# Patient Record
Sex: Male | Born: 1945 | Race: White | Hispanic: No | Marital: Married | Smoking: Former smoker
Health system: Southern US, Community
[De-identification: ages and names within clinical notes are randomized; demographics above are authoritative.]

## PROBLEM LIST (undated history)

## (undated) DIAGNOSIS — G709 Myoneural disorder, unspecified: Secondary | ICD-10-CM

## (undated) DIAGNOSIS — J40 Bronchitis, not specified as acute or chronic: Secondary | ICD-10-CM

## (undated) DIAGNOSIS — K219 Gastro-esophageal reflux disease without esophagitis: Secondary | ICD-10-CM

## (undated) DIAGNOSIS — M1990 Unspecified osteoarthritis, unspecified site: Secondary | ICD-10-CM

## (undated) DIAGNOSIS — E785 Hyperlipidemia, unspecified: Secondary | ICD-10-CM

## (undated) DIAGNOSIS — R251 Tremor, unspecified: Secondary | ICD-10-CM

## (undated) DIAGNOSIS — I219 Acute myocardial infarction, unspecified: Secondary | ICD-10-CM

## (undated) DIAGNOSIS — I10 Essential (primary) hypertension: Secondary | ICD-10-CM

## (undated) DIAGNOSIS — I2510 Atherosclerotic heart disease of native coronary artery without angina pectoris: Secondary | ICD-10-CM

## (undated) HISTORY — DX: Tremor, unspecified: R25.1

## (undated) HISTORY — DX: Atherosclerotic heart disease of native coronary artery without angina pectoris: I25.10

## (undated) HISTORY — DX: Gastro-esophageal reflux disease without esophagitis: K21.9

## (undated) HISTORY — DX: Hyperlipidemia, unspecified: E78.5

## (undated) HISTORY — DX: Essential (primary) hypertension: I10

## (undated) HISTORY — DX: Myoneural disorder, unspecified: G70.9

## (undated) HISTORY — DX: Unspecified osteoarthritis, unspecified site: M19.90

## (undated) HISTORY — DX: Acute myocardial infarction, unspecified: I21.9

---

## 2001-08-25 DIAGNOSIS — I219 Acute myocardial infarction, unspecified: Secondary | ICD-10-CM

## 2001-08-25 HISTORY — DX: Acute myocardial infarction, unspecified: I21.9

## 2001-08-25 HISTORY — PX: CORONARY ANGIOPLASTY WITH STENT PLACEMENT: SHX49

## 2001-11-11 ENCOUNTER — Inpatient Hospital Stay (HOSPITAL_COMMUNITY): Admission: EM | Admit: 2001-11-11 | Discharge: 2001-11-16 | Payer: Self-pay | Admitting: *Deleted

## 2001-12-07 ENCOUNTER — Encounter (HOSPITAL_COMMUNITY): Admission: RE | Admit: 2001-12-07 | Discharge: 2002-01-24 | Payer: Self-pay | Admitting: Internal Medicine

## 2002-02-22 ENCOUNTER — Ambulatory Visit (HOSPITAL_COMMUNITY): Admission: RE | Admit: 2002-02-22 | Discharge: 2002-02-23 | Payer: Self-pay | Admitting: Cardiology

## 2002-05-11 ENCOUNTER — Inpatient Hospital Stay (HOSPITAL_COMMUNITY): Admission: AD | Admit: 2002-05-11 | Discharge: 2002-05-12 | Payer: Self-pay | Admitting: *Deleted

## 2002-11-03 ENCOUNTER — Inpatient Hospital Stay (HOSPITAL_COMMUNITY): Admission: EM | Admit: 2002-11-03 | Discharge: 2002-11-04 | Payer: Self-pay | Admitting: Emergency Medicine

## 2002-11-03 ENCOUNTER — Encounter: Payer: Self-pay | Admitting: Emergency Medicine

## 2005-02-10 ENCOUNTER — Ambulatory Visit: Payer: Self-pay | Admitting: Cardiology

## 2005-07-03 ENCOUNTER — Ambulatory Visit: Payer: Self-pay | Admitting: Gastroenterology

## 2005-07-25 ENCOUNTER — Ambulatory Visit: Payer: Self-pay | Admitting: Gastroenterology

## 2005-08-05 ENCOUNTER — Encounter (INDEPENDENT_AMBULATORY_CARE_PROVIDER_SITE_OTHER): Payer: Self-pay | Admitting: *Deleted

## 2005-08-05 ENCOUNTER — Ambulatory Visit: Payer: Self-pay | Admitting: Gastroenterology

## 2005-10-16 ENCOUNTER — Ambulatory Visit: Payer: Self-pay | Admitting: Cardiology

## 2005-10-17 ENCOUNTER — Ambulatory Visit: Payer: Self-pay | Admitting: Cardiology

## 2006-01-29 ENCOUNTER — Ambulatory Visit: Payer: Self-pay | Admitting: Cardiology

## 2006-05-11 ENCOUNTER — Ambulatory Visit: Payer: Self-pay | Admitting: Gastroenterology

## 2006-06-08 ENCOUNTER — Ambulatory Visit: Payer: Self-pay | Admitting: Gastroenterology

## 2006-07-29 ENCOUNTER — Ambulatory Visit: Payer: Self-pay | Admitting: Cardiology

## 2006-07-31 ENCOUNTER — Ambulatory Visit: Payer: Self-pay | Admitting: Cardiology

## 2006-09-03 ENCOUNTER — Ambulatory Visit: Payer: Self-pay | Admitting: Cardiology

## 2006-11-17 ENCOUNTER — Ambulatory Visit: Payer: Self-pay | Admitting: Cardiology

## 2006-11-17 LAB — CONVERTED CEMR LAB
BUN: 12 mg/dL (ref 6–23)
Basophils Absolute: 0 10*3/uL (ref 0.0–0.1)
Basophils Relative: 0.7 % (ref 0.0–1.0)
CO2: 31 meq/L (ref 19–32)
Calcium: 8.9 mg/dL (ref 8.4–10.5)
Chloride: 108 meq/L (ref 96–112)
Cholesterol: 107 mg/dL (ref 0–200)
Creatinine, Ser: 0.8 mg/dL (ref 0.4–1.5)
Eosinophils Absolute: 0.2 10*3/uL (ref 0.0–0.6)
Eosinophils Relative: 3.1 % (ref 0.0–5.0)
GFR calc Af Amer: 127 mL/min
GFR calc non Af Amer: 105 mL/min
Glucose, Bld: 107 mg/dL — ABNORMAL HIGH (ref 70–99)
HCT: 42.1 % (ref 39.0–52.0)
HDL: 34 mg/dL — ABNORMAL LOW (ref >39.0)
Hemoglobin: 14.6 g/dL (ref 13.0–17.0)
LDL Cholesterol: 63 mg/dL (ref 0–99)
Lymphocytes Relative: 28.4 % (ref 12.0–46.0)
MCHC: 34.6 g/dL (ref 30.0–36.0)
MCV: 94.9 fL (ref 78.0–100.0)
Monocytes Absolute: 0.4 10*3/uL (ref 0.2–0.7)
Monocytes Relative: 8.3 % (ref 3.0–11.0)
Neutro Abs: 2.9 10*3/uL (ref 1.4–7.7)
Neutrophils Relative %: 59.5 % (ref 43.0–77.0)
Platelets: 157 10*3/uL (ref 150–400)
Potassium: 4.1 meq/L (ref 3.5–5.1)
RBC: 4.44 M/uL (ref 4.22–5.81)
RDW: 11.8 % (ref 11.5–14.6)
Sodium: 143 meq/L (ref 135–145)
Total CHOL/HDL Ratio: 3.1
Triglycerides: 52 mg/dL (ref 0–149)
VLDL: 10 mg/dL (ref 0–40)
WBC: 4.9 10*3/uL (ref 4.5–10.5)

## 2006-12-31 ENCOUNTER — Ambulatory Visit: Payer: Self-pay | Admitting: Cardiology

## 2006-12-31 LAB — CONVERTED CEMR LAB
ALT: 27 units/L (ref 0–40)
AST: 23 units/L (ref 0–37)
Albumin: 3.9 g/dL (ref 3.5–5.2)
Alkaline Phosphatase: 69 units/L (ref 39–117)
Bilirubin, Direct: 0.2 mg/dL (ref 0.0–0.3)
Total Bilirubin: 1.8 mg/dL — ABNORMAL HIGH (ref 0.3–1.2)
Total Protein: 6.7 g/dL (ref 6.0–8.3)

## 2007-02-10 ENCOUNTER — Ambulatory Visit: Payer: Self-pay | Admitting: Cardiology

## 2007-02-11 ENCOUNTER — Ambulatory Visit: Payer: Self-pay | Admitting: Cardiology

## 2007-02-11 LAB — CONVERTED CEMR LAB
BUN: 12 mg/dL (ref 6–23)
Basophils Absolute: 0 10*3/uL (ref 0.0–0.1)
Basophils Relative: 0.3 % (ref 0.0–1.0)
CO2: 29 meq/L (ref 19–32)
Calcium: 8.8 mg/dL (ref 8.4–10.5)
Chloride: 113 meq/L — ABNORMAL HIGH (ref 96–112)
Creatinine, Ser: 0.8 mg/dL (ref 0.4–1.5)
Eosinophils Absolute: 0.2 10*3/uL (ref 0.0–0.6)
Eosinophils Relative: 3.9 % (ref 0.0–5.0)
GFR calc Af Amer: 127 mL/min
GFR calc non Af Amer: 105 mL/min
Glucose, Bld: 97 mg/dL (ref 70–99)
HCT: 42.5 % (ref 39.0–52.0)
Hemoglobin: 14.4 g/dL (ref 13.0–17.0)
INR: 0.9 (ref 0.9–2.0)
Lymphocytes Relative: 34.7 % (ref 12.0–46.0)
MCHC: 33.9 g/dL (ref 30.0–36.0)
MCV: 95.8 fL (ref 78.0–100.0)
Monocytes Absolute: 0.4 10*3/uL (ref 0.2–0.7)
Monocytes Relative: 8.2 % (ref 3.0–11.0)
Neutro Abs: 2.3 10*3/uL (ref 1.4–7.7)
Neutrophils Relative %: 52.9 % (ref 43.0–77.0)
Platelets: 156 10*3/uL (ref 150–400)
Potassium: 4.1 meq/L (ref 3.5–5.1)
Prothrombin Time: 11.4 s (ref 10.0–14.0)
RBC: 4.43 M/uL (ref 4.22–5.81)
RDW: 11.9 % (ref 11.5–14.6)
Sodium: 143 meq/L (ref 135–145)
WBC: 4.5 10*3/uL (ref 4.5–10.5)
aPTT: 27.1 s (ref 26.5–36.5)

## 2007-06-05 ENCOUNTER — Emergency Department (HOSPITAL_COMMUNITY): Admission: EM | Admit: 2007-06-05 | Discharge: 2007-06-05 | Payer: Self-pay | Admitting: Emergency Medicine

## 2007-09-29 ENCOUNTER — Ambulatory Visit: Payer: Self-pay | Admitting: Gastroenterology

## 2007-10-25 ENCOUNTER — Ambulatory Visit: Payer: Self-pay | Admitting: Gastroenterology

## 2007-10-25 HISTORY — PX: COLONOSCOPY: SHX174

## 2008-11-14 ENCOUNTER — Encounter: Payer: Self-pay | Admitting: Gastroenterology

## 2008-11-16 DIAGNOSIS — I2510 Atherosclerotic heart disease of native coronary artery without angina pectoris: Secondary | ICD-10-CM | POA: Insufficient documentation

## 2008-11-16 DIAGNOSIS — E785 Hyperlipidemia, unspecified: Secondary | ICD-10-CM | POA: Insufficient documentation

## 2008-11-16 DIAGNOSIS — I10 Essential (primary) hypertension: Secondary | ICD-10-CM | POA: Insufficient documentation

## 2008-11-17 ENCOUNTER — Encounter: Payer: Self-pay | Admitting: Cardiology

## 2008-11-17 ENCOUNTER — Ambulatory Visit: Payer: Self-pay | Admitting: Cardiology

## 2008-11-20 ENCOUNTER — Ambulatory Visit: Payer: Self-pay | Admitting: Cardiology

## 2008-11-20 LAB — CONVERTED CEMR LAB
ALT: 28 units/L (ref 0–53)
AST: 26 units/L (ref 0–37)
Albumin: 3.7 g/dL (ref 3.5–5.2)
Alkaline Phosphatase: 68 units/L (ref 39–117)
BUN: 15 mg/dL (ref 6–23)
Basophils Absolute: 0 10*3/uL (ref 0.0–0.1)
Basophils Relative: 0.8 % (ref 0.0–3.0)
Bilirubin, Direct: 0 mg/dL (ref 0.0–0.3)
CO2: 30 meq/L (ref 19–32)
Calcium: 8.8 mg/dL (ref 8.4–10.5)
Chloride: 111 meq/L (ref 96–112)
Cholesterol: 153 mg/dL (ref 0–200)
Creatinine, Ser: 0.9 mg/dL (ref 0.4–1.5)
Eosinophils Absolute: 0.1 10*3/uL (ref 0.0–0.7)
Eosinophils Relative: 3.4 % (ref 0.0–5.0)
GFR calc non Af Amer: 90.95 mL/min (ref >60)
Glucose, Bld: 96 mg/dL (ref 70–99)
HCT: 40.9 % (ref 39.0–52.0)
HDL: 32.7 mg/dL — ABNORMAL LOW (ref >39.00)
Hemoglobin: 14.3 g/dL (ref 13.0–17.0)
LDL Cholesterol: 96 mg/dL (ref 0–99)
Lymphocytes Relative: 31.8 % (ref 12.0–46.0)
Lymphs Abs: 1.4 10*3/uL (ref 0.7–4.0)
MCHC: 35 g/dL (ref 30.0–36.0)
MCV: 96.6 fL (ref 78.0–100.0)
Monocytes Absolute: 0.4 10*3/uL (ref 0.1–1.0)
Monocytes Relative: 9.1 % (ref 3.0–12.0)
Neutro Abs: 2.4 10*3/uL (ref 1.4–7.7)
Neutrophils Relative %: 54.9 % (ref 43.0–77.0)
Platelets: 121 10*3/uL — ABNORMAL LOW (ref 150.0–400.0)
Potassium: 4 meq/L (ref 3.5–5.1)
RBC: 4.24 M/uL (ref 4.22–5.81)
RDW: 11.8 % (ref 11.5–14.6)
Sodium: 146 meq/L — ABNORMAL HIGH (ref 135–145)
Total Bilirubin: 0.8 mg/dL (ref 0.3–1.2)
Total CHOL/HDL Ratio: 5
Total Protein: 6.7 g/dL (ref 6.0–8.3)
Triglycerides: 122 mg/dL (ref 0.0–149.0)
VLDL: 24.4 mg/dL (ref 0.0–40.0)
WBC: 4.3 10*3/uL — ABNORMAL LOW (ref 4.5–10.5)

## 2009-11-13 ENCOUNTER — Ambulatory Visit: Payer: Self-pay | Admitting: Cardiology

## 2009-11-13 DIAGNOSIS — R5383 Other fatigue: Secondary | ICD-10-CM

## 2009-11-13 DIAGNOSIS — R5381 Other malaise: Secondary | ICD-10-CM | POA: Insufficient documentation

## 2009-11-21 ENCOUNTER — Telehealth (INDEPENDENT_AMBULATORY_CARE_PROVIDER_SITE_OTHER): Payer: Self-pay | Admitting: *Deleted

## 2009-11-22 ENCOUNTER — Ambulatory Visit: Payer: Self-pay | Admitting: Cardiology

## 2009-11-22 ENCOUNTER — Encounter (HOSPITAL_COMMUNITY): Admission: RE | Admit: 2009-11-22 | Discharge: 2010-01-23 | Payer: Self-pay | Admitting: Cardiology

## 2009-11-22 ENCOUNTER — Ambulatory Visit: Payer: Self-pay

## 2009-11-27 ENCOUNTER — Telehealth: Payer: Self-pay | Admitting: Cardiology

## 2009-11-28 ENCOUNTER — Encounter: Payer: Self-pay | Admitting: Cardiology

## 2009-11-28 LAB — CONVERTED CEMR LAB
ALT: 36 units/L (ref 0–53)
AST: 27 units/L (ref 0–37)
Albumin: 4.2 g/dL (ref 3.5–5.2)
Alkaline Phosphatase: 69 units/L (ref 39–117)
BUN: 12 mg/dL (ref 6–23)
Basophils Absolute: 0 10*3/uL (ref 0.0–0.1)
Basophils Relative: 1 % (ref 0.0–3.0)
Bilirubin, Direct: 0.1 mg/dL (ref 0.0–0.3)
CO2: 30 meq/L (ref 19–32)
Calcium: 9.4 mg/dL (ref 8.4–10.5)
Chloride: 107 meq/L (ref 96–112)
Cholesterol: 106 mg/dL (ref 0–200)
Creatinine, Ser: 0.9 mg/dL (ref 0.4–1.5)
Eosinophils Absolute: 0.1 10*3/uL (ref 0.0–0.7)
Eosinophils Relative: 3.2 % (ref 0.0–5.0)
GFR calc non Af Amer: 90.66 mL/min (ref >60)
Glucose, Bld: 96 mg/dL (ref 70–99)
HCT: 44.8 % (ref 39.0–52.0)
HDL: 28.9 mg/dL — ABNORMAL LOW (ref >39.00)
Hemoglobin: 14.6 g/dL (ref 13.0–17.0)
LDL Cholesterol: 56 mg/dL (ref 0–99)
Lymphocytes Relative: 30.7 % (ref 12.0–46.0)
Lymphs Abs: 1.3 10*3/uL (ref 0.7–4.0)
MCHC: 32.7 g/dL (ref 30.0–36.0)
MCV: 97.4 fL (ref 78.0–100.0)
Monocytes Absolute: 0.4 10*3/uL (ref 0.1–1.0)
Monocytes Relative: 8.3 % (ref 3.0–12.0)
Neutro Abs: 2.5 10*3/uL (ref 1.4–7.7)
Neutrophils Relative %: 56.8 % (ref 43.0–77.0)
Platelets: 146 10*3/uL — ABNORMAL LOW (ref 150.0–400.0)
Potassium: 4.3 meq/L (ref 3.5–5.1)
RBC: 4.61 M/uL (ref 4.22–5.81)
RDW: 12.1 % (ref 11.5–14.6)
Sodium: 146 meq/L — ABNORMAL HIGH (ref 135–145)
TSH: 1.55 microintl units/mL (ref 0.35–5.50)
Total Bilirubin: 0.8 mg/dL (ref 0.3–1.2)
Total CHOL/HDL Ratio: 4
Total Protein: 7.6 g/dL (ref 6.0–8.3)
Triglycerides: 108 mg/dL (ref 0.0–149.0)
VLDL: 21.6 mg/dL (ref 0.0–40.0)
WBC: 4.3 10*3/uL — ABNORMAL LOW (ref 4.5–10.5)

## 2009-12-06 ENCOUNTER — Telehealth: Payer: Self-pay | Admitting: Cardiology

## 2010-01-02 ENCOUNTER — Ambulatory Visit: Payer: Self-pay | Admitting: Cardiology

## 2010-01-02 DIAGNOSIS — E782 Mixed hyperlipidemia: Secondary | ICD-10-CM | POA: Insufficient documentation

## 2010-01-07 ENCOUNTER — Encounter: Payer: Self-pay | Admitting: Cardiology

## 2010-01-07 LAB — CONVERTED CEMR LAB
ALT: 33 units/L (ref 0–53)
AST: 25 units/L (ref 0–37)
Albumin: 4.1 g/dL (ref 3.5–5.2)
Alkaline Phosphatase: 68 units/L (ref 39–117)
Bilirubin, Direct: 0.2 mg/dL (ref 0.0–0.3)
Cholesterol: 98 mg/dL (ref 0–200)
HDL: 26.3 mg/dL — ABNORMAL LOW (ref >39.00)
LDL Cholesterol: 57 mg/dL (ref 0–99)
Total Bilirubin: 1.3 mg/dL — ABNORMAL HIGH (ref 0.3–1.2)
Total CHOL/HDL Ratio: 4
Total Protein: 6.8 g/dL (ref 6.0–8.3)
Triglycerides: 74 mg/dL (ref 0.0–149.0)
VLDL: 14.8 mg/dL (ref 0.0–40.0)

## 2010-01-08 ENCOUNTER — Telehealth: Payer: Self-pay | Admitting: Cardiology

## 2010-01-10 ENCOUNTER — Ambulatory Visit: Payer: Self-pay | Admitting: Internal Medicine

## 2010-01-10 LAB — CONVERTED CEMR LAB
Cholesterol, target level: 200 mg/dL
HDL goal, serum: 40 mg/dL
LDL Goal: 70 mg/dL

## 2010-04-15 ENCOUNTER — Ambulatory Visit: Payer: Self-pay | Admitting: Internal Medicine

## 2010-04-18 ENCOUNTER — Ambulatory Visit: Payer: Self-pay | Admitting: Internal Medicine

## 2010-04-19 LAB — CONVERTED CEMR LAB
Alkaline Phosphatase: 74 units/L (ref 39–117)
Bilirubin, Direct: 0.2 mg/dL (ref 0.0–0.3)
Cholesterol: 129 mg/dL (ref 0–200)
LDL Cholesterol: 80 mg/dL (ref 0–99)
Total Protein: 6.9 g/dL (ref 6.0–8.3)

## 2010-09-24 NOTE — Assessment & Plan Note (Signed)
Summary: [name]   Visit Type:  Follow-up Primary Provider:  dr Leonides Sake  CC:  dyslipidemia follow-up.  History of Present Illness:  Lipid Clinic Visit      The patient comes in today for dyslipidemia follow-up.  The patient presents with no complaints of chest pain, shortness of breath, muscle aches, and muscle cramps.  He does complain of some flushing, burning and itching with his Niaspan.  He is tolerating simvastatin with no issues.    Dietary review showed pt has no diet that he follows.  He eats anything he wants most of the time.  He has tried diets in the past but these did not work for him.  He is not interested in trying to change his diet at this time.   Review of exercise habits reveals that the patient is walking.  He walks about 1/2 mile in 20 minutes.  He does work around the yard as well doing some mowing and gardening.    Lipid Management Provider  Weston Brass, PharmD  Current Medications (verified): 1)  Otc Acid Reflux Med .... One Tablet By Mouth Once Daily 2)  Isosorbide Mononitrate Cr 60 Mg Xr24h-Tab (Isosorbide Mononitrate) .... Take One Tablet By Mouth Daily 3)  Nitroglycerin 0.4 Mg Subl (Nitroglycerin) .... One Tablet Under Tongue Every 5 Minutes As Needed For Chest Pain---May Repeat Times Three 4)  Inderal La 120 Mg Xr24h-Cap (Propranolol Hcl) .... One Tab By Mouth Once Daily 5)  Aspirin 81 Mg Tbec (Aspirin) .... Take One Tablet By Mouth Daily 6)  Simvastatin 40 Mg Tabs (Simvastatin) .... Take One Tablet By Mouth Daily At Bedtime  Allergies: 1)  ! Plavix (Clopidogrel Bisulfate)  Past History:  Past Medical History: Last updated: 11/16/2008  GERD.    hypertension     1. Coronary artery disease, status post multiple percutaneous coronary     interventions with stents in all 3 vessels and a jailed dx branch 2. Good LV function. 3. Hyperlipidemia with low HDL. 4. GERD.   Social History: Last updated: 11/17/2008 non-smoker, non-drinker, no drug  abuse He is currently disabled He is married   Vital Signs:  Patient profile:   65 year old male BP sitting:   140 / 88 Cuff size:   regular  Impression & Recommendations:  Problem # 1:  HYPERLIPIDEMIA-MIXED (ICD-272.4) Assessment Unchanged Pt's cholesterol remains relatively unchanged.  TC- 129 (goal<200), TG- 88 (goal<150), HDL- 31 (goal>40), and LDL 80 (goal<70).  AST and ALT are WNL.  Pt having some issues tolerating niaspan.  Discussed results of AIM-HIGH and how niaspan may not be beneficial for increasing HDL only.  Will d/c niaspan at this time.  Will continue simvastatin.  Encouraged pt to limit fried foods and fats in his diet as much as possible.  Given pt well controlled, will have him f/u with cardiologist for cholesterol management at this point.    The following medications were removed from the medication list:    Niaspan 500 Mg Cr-tabs (Niacin (antihyperlipidemic)) .Marland Kitchen... Take two tabs daily at bedtime His updated medication list for this problem includes:    Simvastatin 40 Mg Tabs (Simvastatin) .Marland Kitchen... Take one tablet by mouth daily at bedtime  Other Orders: TLB-Lipid Panel (80061-LIPID) TLB-Hepatic/Liver Function Pnl (80076-HEPATIC)  Patient Instructions: 1)  Stop Niaspan 2)  Continue Simvastatin 3)  Continue to stay active in the yard 4)  F/u with cardiologist in Spring.

## 2010-09-24 NOTE — Progress Notes (Signed)
Summary: F/U on Niaspan   Phone Note Outgoing Call   Call placed by: Sherri Rad, RN, BSN,  December 06, 2009 9:56 AM Call placed to: Patient Summary of Call: I called the pt today to f/u on how he is feeling on the Niaspan. He states he is doing well so far. We will have the pt come back the first week of May for fasting labs to be done. The pt is agreeable and verbalizes understanding. Initial call taken by: Sherri Rad, RN, BSN,  December 06, 2009 9:57 AM

## 2010-09-24 NOTE — Progress Notes (Signed)
Summary: QUIESTION ABOUT LIPID APPT   Phone Note Call from Patient Call back at [phone]   Caller: Spouse/ JOAN Summary of Call: CALLING  ABOUT LIPID APPT Initial call taken by: Judie Grieve,  Jan 08, 2010 12:33 PM  Follow-up for Phone Call        Lipid appt. scheduled. Follow-up by: Bethena Midget, RN, BSN,  Jan 08, 2010 2:13 PM

## 2010-09-24 NOTE — Assessment & Plan Note (Signed)
Summary: rov/tm   Primary Provider:  dr Leonides Sake   History of Present Illness: Mr. Tolson comes in today accompanied by his wife for his first visit in the lipid clinic.  He has been compliant with his current cholesterol medications: Vytorin 10/40 daily and Niaspan 500gm daily which he recently resumed.  He denies any muscle aches or pains.  He rarely has a flushing reaction with his Niaspan.  He reports having some "chest soreness" while on Niaspan 1000mg  daily.  Lipid Management History:      Positive NCEP/ATP III risk factors include male age 64 years old or older, HDL cholesterol less than 40, hypertension, and ASHD (either angina/prior MI/prior CABG).      Lipid Management Provider  Charolotte Eke, PharmD  Preventive Screening-Counseling & Management  Alcohol-Tobacco     Alcohol drinks/day: 0  Current Medications (verified): 1)  Otc Acid Reflux Med .... One Tablet By Mouth Once Daily 2)  Isosorbide Mononitrate Cr 60 Mg Xr24h-Tab (Isosorbide Mononitrate) .... Take One Tablet By Mouth Daily 3)  Nitroglycerin 0.4 Mg Subl (Nitroglycerin) .... One Tablet Under Tongue Every 5 Minutes As Needed For Chest Pain---May Repeat Times Three 4)  Inderal La 120 Mg Xr24h-Cap (Propranolol Hcl) .... One Tab By Mouth Once Daily 5)  Aspirin 81 Mg Tbec (Aspirin) .... Take One Tablet By Mouth Daily 6)  Niaspan 500 Mg Cr-Tabs (Niacin (Antihyperlipidemic)) .... Take Two Tabs Daily At Bedtime 7)  Simvastatin 40 Mg Tabs (Simvastatin) .Marland Kitchen.. 1 Tablet Daily At Bedtime  Allergies (verified): 1)  ! Plavix (Clopidogrel Bisulfate)  Social History: Alcohol drinks/day:  0   Vital Signs:  Patient profile:   65 year old male Weight:      208 pounds Pulse rate:   56 / minute BP sitting:   130 / 85  (right arm)  Impression & Recommendations:  Problem # 1:  MIXED HYPERLIPIDEMIA (ICD-272.2) HisTCs, TGs and LDL are at goals.  His HDL remains below goal.  He is tolerating Niaspan at the reduced-dose.   I'm unsure about the side-effect he claims having while on Niaspan 1000mg  per day.  Vytorin is expensive and he has concerns about the value of ezetimibe.  Since his LDL is well below goal I will switch him over to generic simvastatin and discontinue ezetimibe.  We will also rechallenge with Niaspan 1000mg  per day.  He was instructed to call if he has any new muscle aches or pains or if he has any other concerns or questions.  We will repeat labs and see him again in 3 months.   The following medications were removed from the medication list:    Vytorin 10-40 Mg Tabs (Ezetimibe-simvastatin) .Marland Kitchen... 1 by mouth daily His updated medication list for this problem includes:    Niaspan 500 Mg Cr-tabs (Niacin (antihyperlipidemic)) .Marland Kitchen... Take two tabs daily at bedtime    Simvastatin 40 Mg Tabs (Simvastatin) .Marland Kitchen... 1 tablet daily at bedtime  Lipid Assessment/Plan:      Based on NCEP/ATP III, the patient's risk factor category is "history of coronary disease, peripheral vascular disease, cerebrovascular disease, or aortic aneurysm".  The patient's lipid goals are as follows: Total cholesterol goal is 200; LDL cholesterol goal is 70; HDL cholesterol goal is 40; Triglyceride goal is 150.   Prescriptions: SIMVASTATIN 40 MG TABS (SIMVASTATIN) 1 tablet daily at bedtime  #30 x 2   Entered by:   Charolotte Eke, PharmD   Authorized by:   Laren Boom, MD, [facility]   Signed by:  Charolotte Eke, PharmD on 01/10/2010   Method used:   Electronically to        [facility] [address] [phone]* (retail)       [address]       [address]       [place], [place]  [address]       Ph: [phone]       Fax: [phone]   RxID:   [id number] SIMVASTATIN 40 MG TABS (SIMVASTATIN) 1 tablet daily at bedtime  #30 x 2   Entered by:   Charolotte Eke, PharmD   Authorized by:   Laren Boom, MD, [facility]   Signed by:   Charolotte Eke, PharmD on 01/10/2010   Method used:   Electronically to        [facility]  [address]  [phone] (retail)       [address]       [place], [place]  [address]       Ph: [phone]       Fax: [phone]   RxID:   [id number]

## 2010-09-24 NOTE — Progress Notes (Signed)
Summary: Nuclear Pre-Procedure  Phone Note Outgoing Call Call back at [facility] Phone [phone]   Call placed by: Stanton Kidney, EMT-P,  November 21, 2009 3:29 PM Call placed to: Patient Action Taken: Phone Call Completed Summary of Call: Reviewed information on Myoview Information Sheet (see scanned document for further details).  Spoke with Patient.    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

## 2010-09-24 NOTE — Assessment & Plan Note (Signed)
Summary: yearly  Medications Added * OTC ACID REFLUX MED one tablet by mouth once daily VYTORIN 10-40 MG TABS (EZETIMIBE-SIMVASTATIN) 1 by mouth daily METOPROLOL TARTRATE 50 MG TABS (METOPROLOL TARTRATE) take 1/2 tab two times a day INDERAL LA 120 MG XR24H-CAP (PROPRANOLOL HCL) one tab by mouth once daily      Allergies Added:   Visit Type:  Follow-up Primary Provider:  dr Leonides Sake  CC:  no energy- nerves are shaky- aches and pain.  History of Present Illness: The patient is 65 years old and return for management of CAD. He has had multiple PCI procedures and has stents in all 3 vessels. He is a diagonal branch which was jailed by a stent which has an 80-90% ostial stenosis. He has chronic stable angina presumably related to this disease.  He says over the past year he's had increased fatigue and decreased exercise tolerance. He generally does not have pain with this although he does have occasional angina.  He and his wife also had questions about Vytorin and wondered if they might possibly be able to switch to generic simvastatin.  Current Medications (verified): 1)  Otc Acid Reflux Med .... One Tablet By Mouth Once Daily 2)  Vytorin 10-40 Mg Tabs (Ezetimibe-Simvastatin) .Marland Kitchen.. 1 By Mouth Daily 3)  Isosorbide Mononitrate Cr 60 Mg Xr24h-Tab (Isosorbide Mononitrate) .... Take One Tablet By Mouth Daily 4)  Nitroglycerin 0.4 Mg Subl (Nitroglycerin) .... One Tablet Under Tongue Every 5 Minutes As Needed For Chest Pain---May Repeat Times Three 5)  Metoprolol Tartrate 50 Mg Tabs (Metoprolol Tartrate) .... Take 1/2 Tab Two Times A Day 6)  Aspirin 81 Mg Tbec (Aspirin) .... Take One Tablet By Mouth Daily  Allergies (verified): 1)  ! Plavix (Clopidogrel Bisulfate)  Past History:  Past Medical History: Reviewed history from 11/16/2008 and no changes required.  GERD.    hypertension     1. Coronary artery disease, status post multiple percutaneous coronary     interventions with  stents in all 3 vessels and a jailed dx branch 2. Good LV function. 3. Hyperlipidemia with low HDL. 4. GERD.   Review of Systems       ROS is negative except as outlined in HPI.   Vital Signs:  Patient profile:   65 year old male Height:      72 inches Weight:      210 pounds BMI:     28.58 Pulse rate:   60 / minute BP sitting:   122 / 78  (left arm) Cuff size:   regular  Vitals Entered By: Burnett Kanaris, CNA (November 13, 2009 11:22 AM)  Physical Exam  Additional Exam:  Gen. Well-nourished, in no distress   Neck: No JVD, thyroid not enlarged, no carotid bruits Lungs: No tachypnea, clear without rales, rhonchi or wheezes Cardiovascular: Rhythm regular, PMI not displaced,  heart sounds  normal, no murmurs or gallops, no peripheral edema, pulses normal in all 4 extremities. Abdomen: BS normal, abdomen soft and non-tender without masses or organomegaly, no hepatosplenomegaly. MS: No deformities, no cyanosis or clubbing   Neuro:  No focal sns   Skin:  no lesions    Impression & Recommendations:  Problem # 1:  CAD, NATIVE VESSEL (ICD-414.01) He has multiple previous PCI procedures and has stents in all 3 vessels. He is a jailed diagonal branch with 90% ostial stenosis and chronic angina. Recently he's had decreased exercise tolerance and fatigue. We will plan to evaluate him with a rest-stress exercise or effusion  skin His updated medication list for this problem includes:    Isosorbide Mononitrate Cr 60 Mg Xr24h-tab (Isosorbide mononitrate) .Marland Kitchen... Take one tablet by mouth daily    Nitroglycerin 0.4 Mg Subl (Nitroglycerin) ..... One tablet under tongue every 5 minutes as needed for chest pain---may repeat times three    Inderal La 120 Mg Xr24h-cap (Propranolol hcl) ..... One tab by mouth once daily    Aspirin 81 Mg Tbec (Aspirin) .Marland Kitchen... Take one tablet by mouth daily  Orders: EKG w/ Interpretation (93000) Nuclear Stress Test (Nuc Stress Test)  Problem # 2:  FATIGUE / MALAISE  (ICD-780.79) he has had increased symptoms of fatigue and decreased exercise tolerance over the past year. We will get multiple laboratory studies to help evaluate this as well as the stress test.  Problem # 3:  HYPERLIPIDEMIA-MIXED (ICD-272.4) His wife had some concern about Vytorin because of published reports in the news. I explained the status of Zetia.  We will plan to get a fasting lipid and liver profile and see if we think we can get close to target with simvastatin alone. The following medications were removed from the medication list:    Niaspan 1000 Mg Cr-tabs (Niacin (antihyperlipidemic)) .Marland Kitchen... Take two tab p.o every evrening His updated medication list for this problem includes:    Vytorin 10-40 Mg Tabs (Ezetimibe-simvastatin) .Marland Kitchen... 1 by mouth daily  Problem # 4:  HYPERTENSION, BENIGN (ICD-401.1) This appears controlled on current medications. His updated medication list for this problem includes:    Inderal La 120 Mg Xr24h-cap (Propranolol hcl) ..... One tab by mouth once daily    Aspirin 81 Mg Tbec (Aspirin) .Marland Kitchen... Take one tablet by mouth daily  Patient Instructions: 1)  Your physician recommends that you return for lab work the day of your stress test: lipid/liver/cbc/bmet/tsh (414.01;272.2) 2)  Your physician wants you to follow-up in: 6 months  You will receive a reminder letter in the mail two months in advance. If you don't receive a letter, please call our office to schedule the follow-up appointment. 3)  Your physician has requested that you have an exercise stress myoview.  For further information please visit [email or web address]  Please follow instruction sheet, as given. 4)  Your physician has recommended you make the following change in your medication: 1) STOP metoprolol, 2) Start Inderal LA 120mg  once daily Prescriptions: INDERAL LA 120 MG XR24H-CAP (PROPRANOLOL HCL) one tab by mouth once daily  #30 x 6   Entered by:   Sherri Rad, RN, BSN   Authorized by:    Lenoria Farrier, MD, [facility]   Signed by:   Sherri Rad, RN, BSN on 11/13/2009   Method used:   Electronically to        [facility] [address] [phone]* (retail)       [address]       [address]       [place], [place]  [address]       Ph: [phone]       Fax: [phone]   RxID:   [id number] ISOSORBIDE MONONITRATE CR 60 MG XR24H-TAB (ISOSORBIDE MONONITRATE) Take one tablet by mouth daily  #30 x 6   Entered by:   Sherri Rad, RN, BSN   Authorized by:   Lenoria Farrier, MD, [facility]   Signed by:   Sherri Rad, RN, BSN on 11/13/2009   Method used:   Electronically to        [facility] [address] [phone]* (retail)  [address]       [address]       [place], [place]  [address]       Ph: [phone]       Fax: [phone]   RxID:   [id number] VYTORIN 10-40 MG TABS (EZETIMIBE-SIMVASTATIN) 1 by mouth daily  #30 x 6   Entered by:   Sherri Rad, RN, BSN   Authorized by:   Lenoria Farrier, MD, [facility]   Signed by:   Sherri Rad, RN, BSN on 11/13/2009   Method used:   Electronically to        [facility] [address] [phone]* (retail)       [address]       [address]       [place], [place]  [address]       Ph: [phone]       Fax: [phone]   RxID:   [id number]

## 2010-09-24 NOTE — Miscellaneous (Signed)
Summary: Orders Update  Clinical Lists Changes  Orders: Added new Referral order of Lipid Clinic (Lipid Clinic) - Signed 

## 2010-09-24 NOTE — Progress Notes (Signed)
Summary: Stress test results   Phone Note Call from Patient Call back at Home Phone [phone] Call back at [phone]   Caller: Keith Rake Summary of Call: Want pt stress test results Initial call taken by: Judie Grieve,  November 27, 2009 9:14 AM  Follow-up for Phone Call        11/27/09--10:00AM--pt's wife calling for results of labs and stress test--results given--nt Follow-up by: Ledon Snare, RN,  November 27, 2009 10:03 AM

## 2010-09-24 NOTE — Assessment & Plan Note (Signed)
Summary: Cardiology Nuclear Study  Nuclear Med Background Indications for Stress Test: Evaluation for Ischemia, Graft Patency, Stent Patency   History: Angioplasty, Heart Catheterization, Myocardial Infarction, Myocardial Perfusion Study, Stents  History Comments: '03  Non Q wave MI >Stents-mid RCA, distal RCA, CFX, DX1, PTCA-LAD; '04 [id number], EF=65%  Symptoms: Chest Pain, Chest Pain with Exertion, Fatigue, Fatigue with Exertion  Symptoms Comments: Chronic stable angina. Last episode of NF:AOZHYQ, 11/16/09.   Nuclear Pre-Procedure Cardiac Risk Factors: Family History - CAD, History of Smoking, Hypertension, Lipids, NIDDM, RBBB Caffeine/Decaff Intake: None NPO After: 7:00 PM Lungs: Clear IV 0.9% NS with Angio Cath: 20g     IV Site: (R) AC IV Started by: Irean Hong RN Chest Size (in) 44     Height (in): 72 Weight (lb): 206 BMI: 28.04 Tech Comments: Inderal held x 24 hours.  Nuclear Med Study 1 or 2 day study:  1 day     Stress Test Type:  Stress Reading MD:  Olga Millers, MD     Referring MD:  Charlies Constable, MD Resting Radionuclide:  Technetium 71m Tetrofosmin     Resting Radionuclide Dose:  11 mCi  Stress Radionuclide:  Technetium 11m Tetrofosmin     Stress Radionuclide Dose:  33 mCi   Stress Protocol Exercise Time (min):  8:03 min     Max HR:  137 bpm     Predicted Max HR:  158 bpm  Max Systolic BP: 239 mm Hg     Percent Max HR:  86.71 %     METS: 10.1 Rate Pressure Product:  65784    Stress Test Technologist:  Rea College CMA-N     Nuclear Technologist:  Domenic Polite CNMT  Rest Procedure  Myocardial perfusion imaging was performed at rest 45 minutes following the intravenous administration of Myoview Technetium 39m Tetrofosmin.  Stress Procedure  The patient exercised for 8:03.  The patient stopped due to fatigue and denied any chest pain.  There were no significant ST-T wave changes, only an isolated PAC.  He did have a hypertensive response to exercise,  239/112.   Myoview was injected at peak exercise and myocardial perfusion imaging was performed after a brief delay.  QPS Raw Data Images:  Acuisition technically good; normal left ventricular size. Stress Images:  There is normal uptake in all areas. Rest Images:  Normal homogeneous uptake in all areas of the myocardium. Subtraction (SDS):  No evidence of ischemia. Transient Ischemic Dilatation:  1.02  (Normal <1.22)  Lung/Heart Ratio:  .35  (Normal <0.45)  Quantitative Gated Spect Images QGS EDV:  85 ml QGS ESV:  29 ml QGS EF:  66 % QGS cine images:  Normal wall motion.   Overall Impression  Exercise Capacity: Fair exercise capacity. BP Response: Hypertensive blood pressure response. Clinical Symptoms: No chest pain ECG Impression: No significant ST segment change suggestive of ischemia. Overall Impression: There is no sign of scar or ischemia.  Appended Document: Cardiology Nuclear Study The pt is aware of his results.

## 2010-09-24 NOTE — Miscellaneous (Signed)
Summary: med change  Clinical Lists Changes  Medications: Added new medication of NIASPAN 500 MG CR-TABS (NIACIN (ANTIHYPERLIPIDEMIC)) take one tab by mouth once daily

## 2010-10-14 ENCOUNTER — Encounter (INDEPENDENT_AMBULATORY_CARE_PROVIDER_SITE_OTHER): Payer: Medicare Other | Admitting: Cardiology

## 2010-10-14 ENCOUNTER — Encounter: Payer: Self-pay | Admitting: Cardiology

## 2010-10-14 DIAGNOSIS — I10 Essential (primary) hypertension: Secondary | ICD-10-CM

## 2010-10-14 DIAGNOSIS — I2510 Atherosclerotic heart disease of native coronary artery without angina pectoris: Secondary | ICD-10-CM

## 2010-10-22 NOTE — Assessment & Plan Note (Signed)
Summary: f1y per pt call pt needs medication was a patient of BB/lg  Medications Added OMEPRAZOLE 20 MG CPDR (OMEPRAZOLE) 1 tab by mouth once daily ISOSORBIDE MONONITRATE CR 60 MG XR24H-TAB (ISOSORBIDE MONONITRATE) Take one tablet by mouth daily NITROGLYCERIN 0.4 MG SUBL (NITROGLYCERIN) One tablet under tongue every 5 minutes as needed for chest pain---may repeat times three STOOL SOFTENER 100 MG CAPS (DOCUSATE SODIUM) as needed LISINOPRIL 5 MG TABS (LISINOPRIL) one daily        Primary Provider:  Dr. Leonides Sake  CC:  pt complains of dizziness and sob.  History of Present Illness: 65 yo with history of CAD s/p PCI as well as HTN presents for cardiology followup.  He had a myoview in 3/11 with no evidence for ischemia or infarction.  No symptomatic changes since last appointment.  He does not have any significant chest pain.  He does get some mild shortness of breath walking up a hill or an incline.  BP is running high, 149/91 today.  He is not getting much exercise.    ECG: NSR, rSR', LAE  Labs (3/11): K 4.3, creatinine 0.9 Labs (8/11): LDL 80, HDL 31, LFTs normal  Current Medications (verified): 1)  Omeprazole 20 Mg Cpdr (Omeprazole) .Marland Kitchen.. 1 Tab By Mouth Once Daily 2)  Isosorbide Mononitrate Cr 60 Mg Xr24h-Tab (Isosorbide Mononitrate) .... Take One Tablet By Mouth Daily 3)  Nitroglycerin 0.4 Mg Subl (Nitroglycerin) .... One Tablet Under Tongue Every 5 Minutes As Needed For Chest Pain---May Repeat Times Three 4)  Inderal La 120 Mg Xr24h-Cap (Propranolol Hcl) .... One Tab By Mouth Once Daily 5)  Aspirin 81 Mg Tbec (Aspirin) .... Take One Tablet By Mouth Daily 6)  Simvastatin 40 Mg Tabs (Simvastatin) .... Take One Tablet By Mouth Daily At Bedtime 7)  Stool Softener 100 Mg Caps (Docusate Sodium) .... As Needed  Allergies: 1)  ! Plavix (Clopidogrel Bisulfate)  Past History:  Past Medical History: 1. CAD:  Multiple PCIs.  He has a pinched diagonal with 80-90% ostial stenosis.   ETT-myoview (3/11): 8/03", no ST changes, hypertensive BP response, no evidence for ischemia or infarction, EF 66%.  2. Tremor 3. Hyperlipidemia with low HDL. 4. GERD.  5. HTN  Family History: CAD  Social History: Reviewed history from 11/17/2008 and no changes required. non-smoker, non-drinker, no drug abuse He is currently disabled He is married  Review of Systems       All systems reviewed and negative except as per HPI.   Vital Signs:  Patient profile:   65 year old male Height:      72 inches Weight:      208 pounds BMI:     28.31 Pulse rate:   84 / minute Resp:     14 per minute BP sitting:   149 / 91  (left arm)  Vitals Entered By: Kem Parkinson (October 14, 2010 3:22 PM)  Physical Exam  General:  Well developed, well nourished, in no acute distress. Neck:  Neck supple, no JVD. No masses, thyromegaly or abnormal cervical nodes. Lungs:  Clear bilaterally to auscultation and percussion. Heart:  Non-displaced PMI, chest non-tender; regular rate and rhythm, S1, S2 without murmurs, rubs. +S4. Carotid upstroke normal, no bruit.  Pedals normal pulses. No edema, no varicosities. Abdomen:  Bowel sounds positive; abdomen soft and non-tender without masses, organomegaly, or hernias noted. No hepatosplenomegaly. Extremities:  No clubbing or cyanosis. Neurologic:  Alert and oriented x 3. Psych:  Normal affect.   Impression & Recommendations:  Problem # 1:  CAD, NATIVE VESSEL (ICD-414.01) CAD s/p PCI with pinched ostial diagonal.  Patient has some mild dyspnea with walking up hills that may be anginal and related to the diagonal.  Would continue Imdur and propranolol for angina.  Continue ASA and simvastatin.   Problem # 2:  MIXED HYPERLIPIDEMIA (ICD-272.2) Patient is due to check lipids/LFTs.  Goal LDL < 70.   Problem # 3:  HYPERTENSION, BENIGN (ICD-401.1) BP is high today.  Given known CAD, I am going to start him on an ACEI, lisinopril 5 mg daily.  BMET in 2 wks  and BP check in 2 wks.   Patient Instructions: 1)  Your physician has recommended you make the following change in your medication:  2)  Start Lisinopril 5mg  daily. 3)  Your physician recommends that you return for a FASTING lipid profile/liver profile/BMP/BNP in 2 weeks---414.01 401.9 4)  Take and record your blood pressure every day about 2 hours after you take your medication.  I will call you in 2 weeks to get the readings. Luana Shu [phone]  5)  Your physician wants you to follow-up in: 6 months with Dr Shirlee Latch. Fontaine No 2012)  You will receive a reminder letter in the mail two months in advance. If you don't receive a letter, please call our office to schedule the follow-up appointment. Prescriptions: NITROGLYCERIN 0.4 MG SUBL (NITROGLYCERIN) One tablet under tongue every 5 minutes as needed for chest pain---may repeat times three  #25 x 11   Entered by:   Katina Dung, RN, BSN   Authorized by:   Marca Ancona, MD   Signed by:   Katina Dung, RN, BSN on 10/14/2010   Method used:   Electronically to        [facility] [phone]* (retail)       [address]       [address]       [place], [place]  [address]       Ph: [phone]       Fax: [phone]   RxID:   [id number] INDERAL LA 120 MG XR24H-CAP (PROPRANOLOL HCL) one tab by mouth once daily  #30 x 6   Entered by:   Katina Dung, RN, BSN   Authorized by:   Marca Ancona, MD   Signed by:   Katina Dung, RN, BSN on 10/14/2010   Method used:   Electronically to        [facility] [phone]* (retail)       [address]       [address]       [place], [place]  [address]       Ph: [phone]       Fax: [phone]   RxID:   [id number] ISOSORBIDE MONONITRATE CR 60 MG XR24H-TAB (ISOSORBIDE MONONITRATE) Take one tablet by mouth daily  #30 x 6   Entered by:   Katina Dung, RN, BSN   Authorized by:   Marca Ancona, MD   Signed by:   Katina Dung, RN, BSN on 10/14/2010   Method used:    Electronically to        [facility] [phone]* (retail)       [address]       [address]       [place], [place]  [address]       Ph: [phone]       Fax: [phone]   RxID:   [id number] LISINOPRIL 5  MG TABS (LISINOPRIL) one daily  #30 x 6   Entered by:   Katina Dung, RN, BSN   Authorized by:   Marca Ancona, MD   Signed by:   Katina Dung, RN, BSN on 10/14/2010   Method used:   Electronically to        [facility] [phone]* (retail)       [address]       [address]       [place], [place]  [address]       Ph: [phone]       Fax: [phone]   RxID:   [id number]

## 2010-10-28 ENCOUNTER — Other Ambulatory Visit: Payer: Self-pay | Admitting: Cardiology

## 2010-10-28 ENCOUNTER — Encounter: Payer: Self-pay | Admitting: Cardiology

## 2010-10-28 ENCOUNTER — Other Ambulatory Visit (INDEPENDENT_AMBULATORY_CARE_PROVIDER_SITE_OTHER): Payer: Medicare Other

## 2010-10-28 DIAGNOSIS — I10 Essential (primary) hypertension: Secondary | ICD-10-CM

## 2010-10-28 DIAGNOSIS — R0602 Shortness of breath: Secondary | ICD-10-CM

## 2010-10-28 DIAGNOSIS — I2510 Atherosclerotic heart disease of native coronary artery without angina pectoris: Secondary | ICD-10-CM

## 2010-10-28 LAB — BASIC METABOLIC PANEL
Calcium: 9 mg/dL (ref 8.4–10.5)
Chloride: 106 mEq/L (ref 96–112)
Creatinine, Ser: 0.7 mg/dL (ref 0.4–1.5)

## 2010-10-28 LAB — LIPID PANEL
Cholesterol: 123 mg/dL (ref 0–200)
LDL Cholesterol: 83 mg/dL (ref 0–99)
Triglycerides: 66 mg/dL (ref 0.0–149.0)

## 2010-10-28 LAB — HEPATIC FUNCTION PANEL
Albumin: 3.9 g/dL (ref 3.5–5.2)
Alkaline Phosphatase: 70 U/L (ref 39–117)
Total Protein: 6.6 g/dL (ref 6.0–8.3)

## 2010-10-29 ENCOUNTER — Telehealth: Payer: Self-pay | Admitting: Cardiology

## 2010-11-05 NOTE — Progress Notes (Signed)
Summary: BP readings   Phone Note Outgoing Call   Call placed by: Katina Dung, RN, BSN,  October 29, 2010 7:56 AM Call placed to: Patient Summary of Call: BP readings  Follow-up for Phone Call        Lisinopril 5mg  started 10/14/10--call and get BP readings---LMTCB Katina Dung, RN, BSN  October 29, 2010 8:47 AM      Appended Document: BP readings I talked with patient--recent BP readings  10/15/10 128/81 62    10/16/10 128/84 58    10/17/10 139/80 58     10/18/10  122/77 60    10/19/10 122/84  58    10/20/10 128/83 58   10/21/10 116/77 55    10/22/10 113/81  55    10/23/10 133/77 53   10/24/10 133/86 65    10/25/10 140/88 53    10/27/10 134/82 58    10/28/10 148/83 54 --I will forward to Dr Shirlee Latch for review  Appended Document: BP readings those are ok.   Appended Document: BP readings I talked with pt

## 2011-01-07 NOTE — Assessment & Plan Note (Signed)
[facility]                         GASTROENTEROLOGY OFFICE NOTE   NAME:JOHNSONThaddeus, [name]                      MRN:          [id number]  DATE:09/29/2007                            DOB:          Jan 15, 1946    This is a return office visit for GERD with a prior history of a peptic  stricture.  He states he notes very rare episodes of solid-food  dysphagia that are very transient.  They occur approximately once every  four to six months.  They are occasionally associated with hiccups.  He  has no odynophagia, night-time reflux, abdominal pain, change in bowel  habits, diarrhea, constipation, melena or hematochezia.  He states he  had a flexible sigmoidoscopy by Dr. Leonides Sake several years ago, but  he has not previously had colonoscopy.  There is no family history of  colon cancer, colon polyps or inflammatory bowel disease.   CURRENT MEDICATIONS:  Listed on the chart, updated and reviewed.   MEDICATION ALLERGIES:  PLAVIX, leading to a rash.   EXAM:  No acute distress.  Weight 204.8, blood pressure 116/72, pulse 68 and regular.  HEENT EXAM:  Anicteric sclerae.  Oropharynx clear.  CHEST:  Clear to auscultation bilaterally.  CARDIAC:  Regular rate and rhythm without murmurs appreciated.  ABDOMEN:  Soft and nontender with normoactive bowel sounds, no palpable  organomegaly, masses or hernias.  RECTAL EXAMINATION:  Deferred to time of colonoscopy.  EXTREMITIES:  Without clubbing, cyanosis or edema.  NEUROLOGIC:  Alert and oriented times three, grossly nonfocal.   ASSESSMENT AND PLAN:  1. GERD with a history of a peptic stricture.  He has rare transient      episodes of solid-food dysphagia.  His symptoms have not      progressed.  He would like to wait until his symptoms progress in      frequency or severity before undergoing repeat endoscopy with      dilation and I think this is a reasonable plan.  Renew Nexium 40 mg      p.o. q.a.m. and continue  all standard anti-reflux measures.  2. Colorectal cancer screening.  Risks, benefits, and alternatives to      colonoscopy and possible biopsy and possible polypectomy were      discussed with the patient.  He consents to proceed.  This will be      scheduled electively.     Venita Lick. Russella Dar, MD, [facility]  Electronically Signed    MTS/MedQ  DD: 09/29/2007  DT: 09/29/2007  Job #: [id number]   cc:   Holley Bouche, M.D.

## 2011-01-07 NOTE — Assessment & Plan Note (Signed)
[facility]                               LIPID CLINIC NOTE   NAME:JOHNSONDeshay, Blumenfeld                      MRN:          [id number]  DATE:12/31/2006                            DOB:          Jun 18, 1946    Mr. Wempe and his wife come in today for followup of his  hypercholesterolemia therapy which includes Niaspan 1500 mg daily and  Vytorin 10/40 one tablet daily.  Other medications include metoprolol,  Imdur, 81-mg aspirin and Nexium.  He has been compliant with both of  these medications.  He does report ongoing muscle aches and pains which  have not gotten any more frequent or more severe lately, but he does  attribute this to starting a statin years ago.   PHYSICAL EXAMINATION:  Weight 205 pounds.  Blood pressure 100/60.  Heart  rate of 65.   LABORATORY DATA:  Total cholesterol of 107, triglycerides 52, HDL 34,  LDL 63.   ASSESSMENT:  Mr. Lubeck continues to try to follow a heart-healthy  diet.  His exercise is limited by angina and any exercise plans would  need to be cleared with Dr. [name] .  His triglycerides are great and  area less than 150.  HDL is not quite up to the goal of greater than 45.  His LDL remains at goal of less than 70.  Laboratory work was missing  liver function tests today.   PLAN:  We are going to get the liver function tests drawn today before  he leaves the office and we will call him with those results and set up  a followup appointment.  We are going to continue with the Vytorin as  is, but increase the Niaspan to the maximum dose of 2 g per day in the  hopes of raising his HDL to the goal of greater than 45.  He is going to  continue with his diet and exercise as tolerated.  He was given  prescriptions for Niaspan and Vytorin and samples of Vytorin.      Charolotte Eke, PharmD       Rollene Rotunda, MD, [facility]    TP/MedQ  DD: 12/31/2006  DT: 12/31/2006  Job #: [id number]

## 2011-01-07 NOTE — Assessment & Plan Note (Signed)
[facility]                            CARDIOLOGY OFFICE NOTE   NAME:[name], [name]                      MRN:          [id number]  DATE:02/10/2007                            DOB:          [birth year]    PRIMARY CARE PHYSICIAN:  Holley Bouche, M.D.   CLINICAL HISTORY:  [name] is 65 years old and has coronary artery  disease with previous stents in all three arteries to his heart.  He has  residual 90% stenosis in the diagonal branch, LAD is jailed by a stent  and has chronic angina.   He has been doing fairly well, but he does get chest pain with exertion,  but this has not changed in frequency.  He has had no shortness of  breath, no palpitations.   PAST MEDICAL HISTORY:  Significant for hyperlipidemia and GERD.  He was  seen in our lipid clinic recently and his Niaspan was increased.   CURRENT MEDICATIONS INCLUDE:  Metoprolol, Imdur, aspirin, Zocor, Nexium  and Niaspan.   EXAMINATION:  On examination today, the blood pressure was 110/62 and  the pulse 60 and regular.  There was no venous distention.  The carotid  pulses were full, without bruits.  Chest was clear without rales or  rhonchi.  Cardiac rhythm was regular.  No murmurs or gallops.  Abdomen  was soft, without organomegaly.  Peripheral pulses were full.  There was  no peripheral edema.   Electrocardiogram was normal.   IMPRESSION:  1. Coronary artery disease, status post multiple percutaneous coronary      interventions, as described above, with chronic stable angina.  2. Good LV function.  3. Hyperlipidemia with low HDL.  4. GERD.   RECOMMENDATIONS:  I think [name] cardiac situation is stable.  We  will plan to repeat his lipid profile to see his response to the higher-  dose Niaspan.  I also encouraged him to follow a low glycemic diet, lose  weight, continue to exercise as ways to improve his HDL.  I plan to see  him back in followup in six months.     Bruce  Elvera Lennox [name], MD, [facility]  Electronically Signed   BRB/MedQ  DD: 02/10/2007  DT: 02/10/2007  Job #: [id number]   cc:   Holley Bouche, M.D.

## 2011-01-10 NOTE — Assessment & Plan Note (Signed)
[facility]                            CARDIOLOGY OFFICE NOTE   NAME:[name], [name]                      MRN:          [id number]  DATE:07/29/2006                            DOB:          April 24, 1946    PRIMARY CARE PHYSICIAN:  Holley Bouche, M.D.   CLINICAL HISTORY:  Mr. Litt is 65 years old and has coronary artery  disease and has had a stent placed in all three arteries to his heart.  He has residual 90% stenosis of a diagonal branch of the LAD that is  jailed by the stent.  He has had persistent chest pain and has had  borderline Myoview scans.  We have been managing this medically.   He says he is doing about the same.  Does get intermittent chest pain.  He also complained of some epigastric tenderness in the region of his  xiphoid.  He has had no palpitations.   PAST MEDICAL HISTORY:  1. Hyperlipidemia.  2. GERD.   CURRENT MEDICATIONS:  Metoprolol, Imdur, aspirin, Zocor, Nexium and  Niaspan.  He is only taking Niaspan 500 mg once a day in the morning.   PHYSICAL EXAMINATION:  VITAL SIGNS:  On examination today, the blood  pressure was 118/69, the pulse 50 and regular.  NECK:  There was no venous distention.  The carotid pulses were full  without bruits.  CHEST:  Clear.  CARDIAC:  Cardiac rhythm was regular.  I could hear no murmurs or  gallops.  ABDOMEN:  Soft with normal bowel sounds.  There was no  hepatosplenomegaly.  EXTREMITIES:  Peripheral pulses were full, and there was no peripheral  edema.   An ECG was normal.   IMPRESSION:  1. Coronary artery disease, status post multiple percutaneous coronary      intervention procedures, with chronic stable chest pain.  2. Gastroesophageal reflux disease.  3. Hyperlipidemia.   RECOMMENDATIONS:  I think Mr. Brem is stable from the standpoint of  his heart.  His HDL is quite low, and he is only taking Niaspan 500 mg  once a day.  I recommend that he take two 500 mg at night time  before he  goes to bed, and we will arrange for him to have a lipid profile  and a BMP and CBC in one month.  I will have him seen in our lipid  clinic for further titration of his Niaspan after that.  I will see him  back in follow-up in 6 months.     Bruce Elvera Lennox [name], MD, [facility]  Electronically Signed    BRB/MedQ  DD: 07/29/2006  DT: 07/29/2006  Job #: [id number]

## 2011-01-10 NOTE — Assessment & Plan Note (Signed)
 [facility]                           GASTROENTEROLOGY OFFICE NOTE   NAME:[name], [name]                      MRN:          [id number]  DATE:06/08/2006                            DOB:          1946/04/04    Mr. [name] returns for followup of GERD with liquid and solid food  dysphagia.  His symptoms are still noticeable with hot and cold liquids, and  occasionally with solid foods.  His symptoms have improved substantially  while on Nexium.  His hiccups have completely resolved.   CURRENT MEDICATIONS:  Listed on the chart, updated, and reviewed.   MEDICATION ALLERGIES:  PLAVIX, leading to a rash.   EXAM:  In no acute distress.  Weight 206 pounds, blood pressure 130/76, pulse 60 and regular.  CHEST:  Clear to auscultation bilaterally.  CARDIAC:  Regular rate and rhythm without murmurs appreciated.  ABDOMEN:  Soft and nontender with normoactive bowel sounds.   ASSESSMENT AND PLAN:  Gastroesophageal reflux disease with improving liquid  and solid food dysphagia.  Continue Nexium 40 mg p.o. q. a.m. and maintain  strict antireflux measures.  If his dysphagia to solid foods does not  completely resolve within the next 4 to 6 weeks, he will call the office to  schedule upper endoscopy with dilation.  Plan for return office visit in 12  months.       Venita Lick. Russella Dar, MD, [facility]      MTS/MedQ  DD:  06/08/2006  DT:  06/08/2006  Job #:  [id number]   cc:   Holley Bouche, M.D.

## 2011-01-10 NOTE — Discharge Summary (Signed)
[place]  Patient:    [name], [name] Visit Number: [id number] MRN: [id number]          Service Type: MED Location: [phone] Attending Physician:  Lewayne Bunting Dictated by:   Pennelope Bracken, N.P. Admit Date:  11/10/2001 Disc. Date: 11/16/01   CC:         Leonides Sake, [facility]   Discharge Summary  DATE OF BIRTH:  1946-06-29  PRIMARY CARE PHYSICIAN:  Leonides Sake, [facility]  REASON FOR ADMISSION:  Unstable angina.  DISCHARGE DIAGNOSES: 1. Coronary artery disease status post stent to the right coronary artery    March 20 by Dr. Gerri Spore with subsequent stent of the circumflex artery,    stents of the circumflex and distal right coronary arteries March 24 by Dr.    Charlies Constable. 2. Gastroesophageal reflux disease. 3. Hyperlipidemia. 4. Family history of coronary artery disease. 5. Benign prostatic hypertrophy.  HISTORY OF PRESENT ILLNESS:  This delightful 65 year old gentleman who has past medical history as outlined above presented for evaluation of some exertional chest pain.  He had experienced chest pain 10 years prior and had a stress test work-up which was negative per report.  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  There was some shortness of breath accompanying this and the symptoms resolved with rest. The night prior to admission he had a spell of chest tightness while he was at rest and symptoms resolved on their own.  He is admitted for further evaluation.  HOSPITAL COURSE:  The patient was placed on Lovenox, aspirin, and a beta blocker and maintained on telemetry.  His chest pain continued on the morning following admission when he was taken to the catheterization laboratory for angiography.  Findings by Dr. Eden Emms were as follows.  Left main disease, 20% LAD, 20% proximally, and 20% mid.  First diagonal had 40% ostial stenoses. The circumflex  artery had an 80% stenoses mid vessel and 50% at the OM I.  The right coronary artery revealed 40% proximal disease, 95% mid disease, and 50% distal disease.  Ejection fraction was 55%.  There was no MR.  Left ventricular study revealed mild inferior hypokinesis.  At this point it was decided to perform a PTCA with a stent to the mid RCA and this was done by Dr. Gerri Spore reducing the stenosis there from 99% to 0% with TIMI III flow. Patient was continued on Integrilin and returned to the floor in good condition.  It was elected to perform a staged procedure for intervention on the circumflex and this was done on March 24.  Stent was placed in the circumflex reducing stenosis there from 90% to less than 10 and in the distal RCA reducing stenosis there from 70 to less than 10.  This was performed by Dr. Charlies Constable.  Patient tolerated the procedure well and offered no further complaints.  PHYSICAL EXAMINATION  GENERAL:  On day of discharge patient had no complaints of chest pain or shortness of breath.  VITAL SIGNS:  Blood pressure 120/70, pulse 80, respirations 16.  HEART:  Regular rate and rhythm without murmur, rub, or gallop.  LUNGS:  Clear to auscultation bilaterally.  EXTREMITIES:  Without clubbing, cyanosis, edema.  Left groin sites were without bruit or thrill and minimal amount of ecchymosis.  LABORATORIES:  Telemetry revealed a normal sinus rhythm, pulse of 80.  An EKG prior to admission revealed normal  sinus rhythm with an incomplete right bundle branch block.  There were inferior Q-waves.  Cardiac enzymes were as follows:  #1 CK 67, MB 8.9, troponin I 1.08.  #2 CK 39, MB 0.6, troponin I 0.29.  #3 CK 39, MB 0.6.  CBC at discharge was as follows:  WBC 5.6, hemoglobin 14.0, hematocrit 39.3, platelets 198,000. Discharge chemistry revealed sodium 141, potassium 3.9, chloride 106, CO2 31, glucose 119, BUN 7, creatinine 0.8.  Lipid profile is as follows:  Total cholesterol  159, triglycerides 71, HDL low at 30, LDL 115.  DISPOSITION:  The patient is discharged to home.  DISCHARGE MEDICATIONS: 1. Plavix 75 mg one q.d. for one year. 2. Aspirin 325 mg one q.d. 3. Lopressor 25 mg b.i.d. 4. Protonix 40 mg one q.d. 5. Zocor 20 mg q.h.s.  ACTIVITY:  The patient is to avoid heavy lifting, strenuous activity, and tub baths for two days.  DIET:  Advised low fat, low salt, low cholesterol diet.  WOUND CARE:  The patient knows to call the office if groin wound becomes hard or painful.  FOLLOWUP:  Will be in two weeks with Dr. Ladona Ridgel.  The patient is scheduled for a regular stress test in four weeks.  The patient, per Dr. Delia Chimes request, will not return to work until after his Cardiolite is performed.  He states an understanding of these instructions and agrees to call in the interim with any problems, questions, concerns, or change or increase in symptoms. Dictated by:   Pennelope Bracken, N.P. Attending Physician:  Lewayne Bunting DD:  11/16/01 TD:  11/16/01 Job: [id number]

## 2011-01-10 NOTE — Cardiovascular Report (Signed)
[place]. [facility]  Patient:    [name], [name] Visit Number: [id number] MRN: [id number]          Service Type: MED Location: [phone] Attending Physician:  Lewayne Bunting Dictated by:   Daisey Must, M.D. [facility] Proc. Date: 11/11/01 Admit Date:  11/10/2001   CC:         Arvella Merles, M.D.  Rudene Christians. Ladona Ridgel, M.D. [facility]   Cardiac Catheterization  PROCEDURE:  PTCA with stent placement in the mid right coronary artery.  INDICATIONS:  [name] is a 65 year old male who is admitted to the hospital several hours ago with chest pain.  He ruled in for a non-Q wave myocardial infarction.  He had recurrent pain this morning and was brought urgently to the cardiac catheterization laboratory.  Catheterization by Dr. Eden Emms revealed significant coronary artery disease with a 99% stenosis of the mid right coronary artery with TIMI 2 flow.  He also had moderate diffuse disease in the proximal and distal right coronary artery.  He had significant disease in the left circumflex and moderate disease in the left anterior descending artery.  After review of the images, we opted to proceed with percutaneous intervention of the right coronary artery.  DESCRIPTION OF PROCEDURE:  A preexisting  6 French sheath in the right femoral artery was exchanged over a wire for a 7 [place] sheath.  Heparin and Integrilin were administered per protocol.  We used a 7 [place] guiding catheter with sideholes and a BMW wire.  The lesion in the mid right coronary artery was predilated with a 3.0 x 20 mm Quantum balloon inflated to 10 atmospheres.  We then deployed a 3.0 x 24 mm Express II stent across the lesion at a deployment pressure of 10 atmospheres.  This stent was postdilated with a 3.0 x 20 mm Quantum balloon inflated to 18 atmospheres in the distal aspect of the stent, 22 atmospheres in the proximal aspect of the stent. We then went back with a 3.25 x 12 mm Quantum balloon  and inflated this to 20 atmospheres in the very proximal edge of the stent and 12 atmospheres in the midstent.  Final angiographic images revealed patency of the mid right coronary artery with 0% residual stenosis and TIMI 3 flow.  There was moderate diffuse disease in the proximal and distal vessel which was unchanged.  COMPLICATIONS:  None.  RESULTS:  Successful PTCA with stent placement in the mid right coronary artery reducing a 99% stenosis with TIMI 2 flow to 0% residual with TIMI 3 flow.  PLAN:  Integrilin will be continued for an additional 24 hours.  Plavix will be administered for a recommended 6 to 12 months.  We anticipate staged percutaneous intervention of the left circumflex in three or four days. Dictated by:   Daisey Must, M.D. [facility] Attending Physician:  Lewayne Bunting DD:  11/11/01 TD:  11/14/01 Job: [id number]

## 2011-01-10 NOTE — Cardiovascular Report (Signed)
[place]. [facility]  Patient:    VENICE, LIZ Visit Number: [id number] MRN: [id number]          Service Type: CAT Location: [phone] Attending Physician:  Lenoria Farrier Dictated by:   Everardo Beals [name], M.D. [facility] Proc. Date: 02/22/02 Admit Date:  02/22/2002 Discharge Date: 02/23/2002   CC:         Arvella Merles, M.D.  Cardiopulmonary Laboratory   Cardiac Catheterization  PROCEDURES PERFORMED: Cardiac catheterization and percutaneous coronary intervention.  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 Gerri Spore and subsequently stenting of the circumflex artery by myself. He had persistent symptoms which have become worse recently and made it very difficult for him to participate in his work. We decided to bring him in for evaluation with angiography.  DESCRIPTION OF PROCEDURE: The procedure was performed via the right femoral artery using an arterial sheath and 6 French preformed coronary catheters.  A front wall arterial puncture was performed and Omnipaque contrast was used. After completion of the diagnostic study we made a decision to proceed with intervention on the LAD and diagonal branch.  The patient was given weight-adjusted heparin to prolong the ACT to greater than 200 seconds and was given Integrilin in infusion. We used an 8 [place] guiding catheter with side holes. We passed a rotafloppy wire down the diagonal branch and the LAD. Using a 1.5 bur, we performed two runs at approximately 150,000 RPMs for 12 seconds each. We then upgraded to a 2.15 bur and performed three runs at approximately 160,000 RPMs for 13 to 17 seconds each. This gave a suboptimal result so we exchanged for a luge wire and passed the luge wire down the diagonal branch of the LAD and then using a 2.5 x  10 mm Cutting Balloon performed two inflations of 8 atmospheres for 42 and 39 seconds. This resulted in intimal disruption and so we felt we would have to stent this vessel and we decided to treat the LAD first. We initially were going to perform two stents with a crush technique but we did not have a Cypher stent for the side branch so we then elected to do atherectomy of the LAD and place a non-Cypher stent in the diagonal branch. We exchanged for the Guidant guiding catheter used with the FlexiCut and used a 3.0 x 3.4 FlexiCut. We passed this down the LAD and performed two cuts at 5 PSI, one cut at 10 PSI, three cuts at 20 PSI and five cuts at 45 PSI. This resulted in a good angiographic result and we obtained a moderate amount of atheromatous material.  We then placed a luge wire down the diagonal branch of the LAD and using a 2.25 x 8 mm Pixel deployed this with two inflations of 12 and 16 atmospheres for 43 and 33 seconds. We wanted to be careful not to get the stent into the lumen, and we were just shy of part of the ostium on one side of the diagonal. We then used a 3.5 x 20 mm CrossSail balloon in the LAD and performed two inflations of 10 atmospheres for 35 seconds. We then performed one more dilatation in the ostium of the diagonal branch with the same 2.5 x 8 mm balloon that we used with the stent. Repeat diagnostic studies were then performed through  the guiding catheters. This was a long procedure. The patient tolerated the procedure well and left the laboratory in satisfactory condition.  RESULTS: The left main coronary artery: The left main coronary artery was free of significant disease.  Left anterior descending: The left anterior descending artery gave rise to the diagonal branch and septal perforator. The diagonal branch had 90% ostial stenosis. There was 70% stenosis in the LAD just adjacent and distal to the diagonal branch.  Circumflex artery: The circumflex  artery gave rise to a marginal branch, an atrial branch, a second marginal branch and two posterolateral branches. There was less than 10% stenosis at the stent site in the mid vessel.  Right coronary artery: The right coronary artery is a dominant vessel that gave rise to a conus branch, two right ventricular branches, posterior descending branch, and two posterolateral branches. There was 50% narrowing in the stent in the mid vessel and there was less than 10% narrowing at the stent in the distal vessel.  LEFT VENTRICULOGRAPHY: The left ventriculogram performed in the RAO projection showed good wall motion with no areas of hypokinesis. The estimated ejection fraction was 60%.  Following stenting and PTCA of the diagonal branch of the LAD, the stenosis improved from 90% to 50%.  Following DCA and PTCA of the LAD, the stenosis improved from 70% to 0%.  CONCLUSIONS: 1. Coronary artery disease, status post previous non-Q-wave myocardial    infarction and previous percutaneous interventions as described above    with 70% stenosis in the proximal left anterior descending and 90%    ostial stenosis in the diagonal branch, less than 10% stenosis in the    stent in the circumflex artery, a 50% narrowing within the stent in the    mid right coronary artery and less than 10% narrowing within the stent in    the distal right coronary artery and normal left ventricular function. 2. Successful directional atherectomy and percutaneous transluminal coronary    angioplasty of the left anterior descending with improvement in percent    diameter narrowing from 30% to 0%. 3. Successful rotational atherectomy, Cutting Balloon angioplasty and    stenting of the diagonal branch in the left anterior descending (branch    stenosis) with improvement in percent diameter narrowing from 90% to    50%.  DISPOSITION: The patient was returned to the postangioplasty unit for further  observation. We were not  able to completely cover the ostium without putting the stent in the LAD, so we are left with slightly suboptimal result at the ostium. If he has recurrence of this, then we may have options to treat this with a Cypher stent at a later time. Dictated by:   Everardo Beals [name], M.D. [facility] Attending Physician:  Lenoria Farrier DD:  02/22/02 TD:  02/23/02 Job: [id number]

## 2011-01-10 NOTE — Discharge Summary (Signed)
NAME:  [name], [name] NO.:  [id number]   MEDICAL RECORD NO.:  [id number]                   PATIENT TYPE:  INP   LOCATION:  3715                                 FACILITY:  [facility]   PHYSICIAN:  Cecil Cranker, M.D. [facility]         DATE OF BIRTH:  1946-07-30   DATE OF ADMISSION:  05/11/2002  DATE OF DISCHARGE:  05/12/2002                           DISCHARGE SUMMARY - REFERRING   HISTORY OF PRESENT ILLNESS:  The patient is a 65 year old white male who was  seen in the office on May 11, 2002, and [name], M.D., for  exertional chest discomfort.  Over the proceeding several days to the office  appointment, he has noted more fatigue than usual.  He has also noted  exertional chest discomfort similar to his prior episodes while walking in  the yard.  He did take a sublingual nitroglycerin approximately two days  prior to admission with relief of the discomfort.  The discomfort returned  on May 10, 2002, which required a sublingual nitroglycerin.  He felt  that he should be admitted for cardiac catheterization for reevaluation of  his coronary artery disease.   He has a history of two stents placed to the RCA in March of 2003 and  subsequent stenting of the circumflex secondary to recurring symptoms.  He  also has required intervention to his LAD with an arthrectomy and stenting  of the diagonal branch.  His last catheterization was in July of 2003, at  which time he had a directional arthrectomy and angioplasty of the LAD  followed by a successful arthrectomy and cutting balloon angioplasty and  stenting of the diagonal.   PAST MEDICAL HISTORY:  His history is notable for treated hyperlipidemia   ALLERGIES:  PLAVIX.   HOSPITAL COURSE:  The patient was admitted to [facility] on 3700.  He underwent cardiac catheterization on May 12, 2002, by Salvadore Farber, M.D.  Left ventriculogram was not performed  secondary to ectopy.  He had a proximal 40% RCA.  He had a 40-50% end-stent  restenosis of the mid RCA.  The distal RCA stent was widely patent.  The  stent in the circumflex was also widely patent.  At the prior [facility] in the  LAD, he had a 30% stenosis.  Just prior to the stent in the diagonal 1, he  had an ostial 90% lesion.  Dr. Samule Ohm noted that he had recurrent severe  stenosis of the ostium of the diagonal 1.  He felt that the optimal  mechanical treatment would be a coated stent technique with stents in both  the diagonal and LAD.  However, there is not a 2.25 mm drug-eluding stent  available at this time.  FDA approval is in the works.  Dr. Samule Ohm felt that  we should continue medical treatment and has added nitrates to his medical  regimen.  He felt that  if medical treatment was unsuccessful, then he should  have mechanical intervention of the diagonal lesion.   Post sheath removal and bed rest, he was ambulating without difficulty.  The  catheterization site was intact.  Thus, it was felt that he could be  discharged home.   DISCHARGE DIAGNOSES:  1. Unstable angina.  2. Progressive coronary artery disease as previously described.   DISPOSITION:  He is discharged home.   DISCHARGE MEDICATIONS:  He received a new prescription for Imdur 30 mg q.d.  He was asked to continue his aspirin 325 mg q.d., Lopressor 50 mg half of a  tablet b.i.d., Zocor 20 mg q.h.s., and sublingual nitroglycerin as needed.   ACTIVITY:  He was advised no lifting, driving, sexual activity, or heavy  exertion for two days.   DIET:  Maintain a low-salt, low-fat, low-cholesterol diet.   WOUND CARE:  If he has any problems with his catheterization site, he was  asked to call his immediately.   SPECIAL INSTRUCTIONS:  He was advised no smoking or tobacco products.   FOLLOW UP:  He will follow up with Dr. Everardo Beals. Brodie's P.A. in the office  on May 31, 2002, at 3 p.m.      [name], M.D. [facility]    EW/MEDQ  D:  05/12/2002  T:  05/14/2002  Job:  [id number]   cc:   Everardo Beals. [name], M.D. [facility]  [address]  [place]  [place] [address]

## 2011-01-10 NOTE — Cardiovascular Report (Signed)
[place]. [facility]  Patient:    [name], [name] Visit Number: [id number] MRN: [id number]          Service Type: MED Location: [phone] Attending Physician:  Lewayne Bunting Dictated by:   Noralyn Pick. Eden Emms, M.D. [facility] Admit Date:  11/10/2001   CC:         Rudene Christians. Ladona Ridgel, M.D. [facility]  Arvella Merles, M.D.   Cardiac Catheterization  INDICATIONS:  Subendocardial MI.  DESCRIPTION OF PROCEDURE:  Cine catheterization was done from the right femoral artery using standard Judkins catheters.  Left main coronary artery had a 20% discrete lesion.  Left anterior descending artery had 20% multiple discrete lesions in the proximal and midvessel.  The first diagonal branch had a 40% ostial lesion.  The circumflex coronary artery had a long area of 80% midvessel disease.  The first obtuse marginal branch had 50% multiple discrete lesions.  Right coronary artery had 40% proximal disease.  There was a 95% hazy lesion in the midvessel with slow flow.  This was the culprit infarct artery and there were 50% multiple discrete lesions distally.  RAO ventriculography.  RAO ventriculography revealed mild inferior basal wall hypokinesis.  EF was 55%.  There was no MR.  Left ventricular pressure was 123/17, aortic pressure was 123/69.  IMPRESSION:  The patient will have PTCA and stenting on the mid right coronary artery today.  I suspect he will need to come back Monday to do the mid circumflex vessel.  Overall, his artery is patent and he has not had a large myocardial infarction and hopefully he will do well. Dictated by:   Noralyn Pick Eden Emms, M.D. [facility] Attending Physician:  Lewayne Bunting DD:  11/11/01 TD:  11/13/01 Job: [id number]

## 2011-01-10 NOTE — H&P (Signed)
NAME:  [name], [name] NO.:  [id number]   MEDICAL RECORD NO.:  [id number]                   PATIENT TYPE:  EMS   LOCATION:  MAJO                                 FACILITY:  [facility]   PHYSICIAN:  Jesse Sans. Wall, M.D. [facility]            DATE OF BIRTH:  Jan 28, 1946   DATE OF ADMISSION:  11/03/2002  DATE OF DISCHARGE:                                HISTORY & PHYSICAL   PRIMARY CARE PHYSICIAN:  Holley Bouche, M.D.   CHIEF COMPLAINT:  Chest pain.   HISTORY OF PRESENT ILLNESS:  The patient is a 65 year old, married, white  male who has coronary artery disease with multiple interventions.  He has  been having exertional subxiphoid chest discomfort yesterday and today.  Today he took a nitroglycerin with relief in about 15 minutes or less.  The  total duration of his symptoms was about 30 minutes.   This is a similar to his angina in the past.   His last catheterization was in September of 2003, which showed a 40%  proximal right coronary artery, 40-50% mid right coronary artery at the  previous stent site, 90% diagonal 1 prior to a stent, 30% posterolateral  lesion, and a patent circumflex stent.  He __________ treated  medically.   MEDICATIONS:  1. Zocor 20 mg p.o. q.h.s.  2. Lopressor 25 mg b.i.d.  3. Enteric-coated aspirin 325 mg a day.  4. Imdur 60 mg a day.  5. Nitroglycerin 0.4 mg p.r.n.   ALLERGIES:  He is intolerant of PLAVIX.  It causes blisters and itching.   RISK FACTORS:  Male sex, hyperlipidemia, and known CAD, of course.   SOCIAL HISTORY:  He lives with his wife of 30 years in [place].  He has  not worked since July of 2003.  He has a 30-pack-year smoking history, but  does not smoke now.  He has not drank any alcohol in two years.   FAMILY HISTORY:  Really noncontributory.   REVIEW OF SYSTEMS:  Noncontributory, but listed by Joellyn Rued, P.A., on  her diagnostic admit form.   PHYSICAL EXAMINATION:  VITAL SIGNS:  Blood pressure  146/86, pulse 77 and  regular, temperature 97.9 degrees, respiratory rate 16, saturations 98% on  room air.  GENERAL APPEARANCE:  He is a well-developed, well-nourished, white male in  no acute distress.  HEENT:  Unremarkable.  NECK:  No JVD.  Carotid upstrokes are equal bilaterally without bruits.  There is no thyromegaly.  The trachea is midline.  CARDIOVASCULAR:  Regular rate and rhythm without gallop.  There is a  slightly widely split S2.  LUNGS:  Clear to auscultation and percussion.  SKIN:  No lesions or rashes.  ABDOMEN:  Soft.  Good bowel sounds.  There is no hepatosplenomegaly.  EXTREMITIES:  No cyanosis, clubbing, or edema.  NEUROLOGIC:  Grossly intact.   LABORATORY DATA:  The chest x-ray is pending.  The  EKG is compared to the  one in the office.  There are no acute changes.  He has sinus rhythm with  incomplete right bundle branch block.   Initial laboratory data shows negative enzymes.  Other laboratory data is  unremarkable.   ASSESSMENT:  1. Exertional angina manifested by chest discomfort and fatigue.  He has     known coronary artery disease with an OD lesion that is not amenable to     intervention.  We discussed with Charlies Constable, M.D., his primary     cardiologist, who felt we should admit him, observe, rule out for an MI,     and if he ruled out, send him home tomorrow morning.  He has an     appointment with Dr. [name] on November 10, 2002.  We will go ahead and     increase his Lopressor to 50 mg b.i.d.  He may need titration upward of     his Imdur as an outpatient.  2. Other problems as listed above.                                               Thomas C. Daleen Squibb, M.D. [facility]    TCW/MEDQ  D:  11/03/2002  T:  11/03/2002  Job:  [id number]   cc:   Holley Bouche, M.D.  [address]  [place], [place] [address]  Fax: [phone]   Charlies Constable, M.D. [facility]

## 2011-01-10 NOTE — Cardiovascular Report (Signed)
NAME:  [name], [name] NO.:  [id number]   MEDICAL RECORD NO.:  [id number]                   PATIENT TYPE:  INP   LOCATION:  3715                                 FACILITY:  [facility]   PHYSICIAN:  Salvadore Farber, M.D. [facility]         DATE OF BIRTH:  [birth year]   DATE OF PROCEDURE:  05/12/2002  DATE OF DISCHARGE:                              CARDIAC CATHETERIZATION   PROCEDURES:  Coronary angiography, left heart catheterization, left  ventriculography.   INDICATIONS:  The patient is a 65 year old gentleman, status post multiple  percutaneous coronary interventions. The most recent of these was on February 22, 2002, in which Dr. [name] performed directional coronary arthrectomy of the  proximal LAD and the ostium of the first diagonal branch with stenting of  the diagonal branch. The patient initially did well after the procedure.  However, over the past several weeks, he has had recurrent exertional angina  including an episode of angina at rest. He is therefore referred for  diagnostic angiography.   DIAGNOSTIC TECHNIQUE:  Under 2% lidocaine local anesthesia, a 6 French  sheath was placed in the right femoral artery using the modified Seldinger  technique. Angiography was performed using JL4 and JR4 catheters. The  pigtail catheter was advanced in the left ventricle.  Ventriculography was  performed by power injection. LV pressures were measured.  Following the  procedure, the patient was transferred to the hold room in stable condition  and the sheath will be removed there.   COMPLICATIONS:  None.   FINDINGS:  1. Left main:  Angiographically normal.  2. LAD:  There is approximately 30% stenosis in the proximal LAD just     proximal to the takeoff of the first diagonal branch.  This is the site     of prior directional coronary arthrectomy.  The first diagonal branch has     an ostial 90% stenosis just proximal to the beginning of the stent.  This  vessel is 2.2 mm in diameter.  3. Circumflex:  The circumflex is a moderate sized vessel giving rise to two     obtuse marginal branches.  The stent in the midportion of the vessel is     widely patent.  4. RCA:  The RCA is a large vessel. The mid RCA stent has approximately 40-     50% in-stent re-stenosis which is stable compared to the study of February 22, 2002.  The distal RCA stent is widely patent.   LEFT VENTRICULOGRAM:  Unable to calculate EF due to ectopy.  No aortic  stenosis. LV 143/7/18 after the administration of contrast.   IMPRESSION/PLAN:  The patient has recurrent severe stenosis of the ostium of  the first diagonal branch with excellent patency of the proximal LAD. The  optimal mechanical therapy would probably be the crushed stent technique  with stents placed in both the diagonal branch and the  left anterior  descending. However, there is currently no 2.5 mm drug-eluting stent  available. The approval of one is expected in the next several months.  Treatment of this lesion with a non drug-eluting stent would be associated  with a high likelihood of re-stenosis and the crushed stent technique would  effectively preclude further brachytherapy of this vessel.   Therefore, I recommend a trial of medical therapy with nitrates added to his  regimen. If this proves unsuccessful, then mechanical intervention could  again be considered, hopefully after a 2.25 mm drug-eluting stent becomes  available.                                                      Salvadore Farber, M.D. [facility]    WED/MEDQ  D:  05/12/2002  T:  05/13/2002  Job:  [id number]

## 2011-01-10 NOTE — Discharge Summary (Signed)
NAME:  [name], [name] NO.:  [id number]   MEDICAL RECORD NO.:  [id number]                   PATIENT TYPE:  INP   LOCATION:  2037                                 FACILITY:  [facility]   PHYSICIAN:  Jesse Sans. Wall, M.D. [facility]            DATE OF BIRTH:  09-01-1945   DATE OF ADMISSION:  11/03/2002  DATE OF DISCHARGE:  11/04/2002                           DISCHARGE SUMMARY - REFERRING   PROCEDURE:  None.   HOSPITAL COURSE:  The patient is a 65 year old male patient of Dr. Charlies Constable with known coronary artery disease and is status post multiple  interventions.  His last catheterization was in September of 2003 and it  showed 90% diagonal prior to a previous stent.  He has been treated  medically since this and has done well.   He came to the hospital on November 03, 2002 for chest pain that was substernal  in nature and his usual angina symptoms.  It was generally exertional and  associated with fatigue.  He was admitted to rule out MI.   His enzymes were negative for MI and the situation was discussed with Dr.  Charlies Constable.  It was felt that medical therapy was still the best option  for him and his Lopressor had been increased from 25 mg b.i.d. to 50 mg  b.i.d.  He tolerated this well with a heart rate in the 60s and a systolic  blood pressure generally in the 120s to 130s.  If he has further symptoms,  his Imdur can be increased as well.   Once his medications had been adjusted, the patient was seen by cardiac  rehabilitation. He ambulated greater than 100 feet with a heart rate in the  60s, and after his ambulation, his oxygen saturation was 97% on room air.  Risk factors were reviewed including the use of sublingual nitroglycerin and  calling 911.  He stated that he could improve his compliance with a low fat  diet.  He had no chest pain nor shortness of breath with the ambulation.  His chest x-ray showed no acute disease.   Dr. Charlies Constable evaluated  the patient and felt he was stable for discharge  on November 04, 2002 with outpatient follow-up arranged.   LABORATORY DATA:  Hemoglobin 15.2, hematocrit 42.8, WBC 5.8, platelets  163,000.  Sodium 143, potassium 4.3, chloride 109, BUN 14, creatinine 1.0,  glucose 100.  Serial CK-MB and troponin I negative for MI.  Total  cholesterol 114, triglycerides 90, HDL 29, LDL 67.   Chest x-ray showed no acute disease.   CONDITION ON DISCHARGE:  Stable.   DISCHARGE DIAGNOSES:  1. Anginal pain:  Medical therapy recommended.  2. Status post percutaneous transluminal coronary angioplasty and stent to     the first diagonal.  3. Status post percutaneous transluminal coronary angioplasty and stent to     the right coronary artery.  4.  Status post cardiac catheterization in September of 2003 with proximal     right coronary artery 40%, right coronary artery end-stent 40-50%,     diagonal-1 90% with no end-stent restenosis.  Posterolateral artery 30%     and circumflex stent patent.  5. Status post percutaneous transluminal coronary angioplasty and stent to     the circumflex.  6. Intolerance of Plavix.  7. Hyperlipidemia.  8. Remote history of tobacco use.  9. Hypertension.  10.      History of joint pain in the left knee and right shoulder.  11.      History of hand tremors.   DISCHARGE INSTRUCTIONS:  1. His activity level is to be as tolerated.  2. He is to stick to a diet that is low salt and fat.  3. He is to keep his November 10, 2002 appointment with Dr. Charlies Constable.  4. He is to follow up with Dr. Tiburcio Pea on a p.r.n. basis.   DISCHARGE MEDICATIONS:  1. Zocor 20 mg q.d.  2. Imdur 60 mg q.d.  3. Metoprolol 50 mg b.i.d.  4. Coated aspirin 325 mg q.d.  5. Nitroglycerin p.r.n.     Lavella Hammock, P.A. [facility]                  Thomas C. Daleen Squibb, M.D. [facility]    RG/MEDQ  D:  11/04/2002  T:  11/04/2002  Job:  [id number]   cc:   Charlies Constable, M.D. [facility]   Holley Bouche, M.D.  [address]  [place], [place] [address]  Fax: [phone]

## 2011-01-10 NOTE — Discharge Summary (Signed)
[place]. [facility]  Patient:    [name], [name] Visit Number: [id number] MRN: [id number]          Service Type: CAT Location: [phone] Attending Physician:  Lenoria Farrier Dictated by:   Pennelope Bracken, N.P. Admit Date:  02/22/2002 Discharge Date: 02/23/2002   CC:         Arvella Merles, M.D.   Discharge Summary  DATE OF BIRTH:  06/20/1946  CARDIOLOGIST:  Everardo Beals. [name], M.D.  REASON FOR ADMISSION:  Chest pain.  DISCHARGE DIAGNOSES: 1. Coronary artery disease, status post non-Q-wave myocardial infarction in    February 2003 with stenting of the right coronary artery by Dr. Gerri Spore;    subsequent stenting to the circumflex by Dr. [name].  Treatment this    admission with a coronary angiography revealing 70% proximal disease in the    left anterior descending artery, 90% distally; circumflex 10% in-stent    restenosis; right coronary artery 50% mid stent stenosis and less than 10%    distal stent disease; normal left ventricular function.  A stent was placed    to the diagonal this admission with reduction of a 90% stenosis there to    50%, and the left anterior descending artery was angioplastied, reducing a    70% stenosis there to 0. 2. Hyperlipidemia, currently on statin therapy.  HISTORY OF PRESENT ILLNESS:  This unfortunate 65 year old gentleman with history as outlined above was admitted for elective coronary angiography for recurrent chest pain.  A Cardiolite scan performed two months prior to this admission showed no evidence of ischemia; however, the patient does heavy lifting and was having a difficult time carrying on at work, so it was decided to reevaluate him with cardiac catheterization to look for evidence of obstructive disease.  HOSPITAL COURSE:  The patient was admitted, placed on telemetry, and maintained on his home medications.  He was taken to the cardiac catheterization laboratory on February 22, 2002 by Dr.  [name] and procedures were performed as outlined above.  The patient tolerated the procedure well and was continued on Integrilin for several hours post procedure.  He did experience some oozing at his groin site but this was effectively managed.  He recovered uneventfully and on July 2 was deemed suitable for discharge by Dr. [name].  PHYSICAL EXAMINATION:  SUBJECTIVE:  On day of discharge the patient offered no complaints of chest pain, dyspnea, or shortness of breath.  VITAL SIGNS:  Blood pressure 142/70, pulse 68, respirations 20, saturation 95% on room air, afebrile.  GENERAL:  A well-nourished middle-aged male in no acute distress.  Hands revealed fine tremor.  NECK:  No JVD.  HEART:  Regular rate and rhythm.  LUNGS:  Clear to auscultation.  GROIN:  Right femoral arterial site within normal limits.  LABORATORY DATA:  Discharge hemogram:  WBC 7.2, hemoglobin 14.6, hematocrit 42.4, platelets 143.  Chemistry:  Sodium 142, potassium 4.2, BUN 9, creatinine 0.9, glucose 114.  Postprocedure CK 88, MB 6.9.  Postprocedure EKG reveals normal sinus rhythm with ventricular rate of 69 and no ischemic changes.  DISPOSITION:  The patient is discharged to home in the care of his very supportive wife on the following medications.  MEDICATIONS: 1. Aspirin 325 one q.d. 2. Ticlid 250 one b.i.d. for one month.  The patient has an allergy to PLAVIX. 3. Zocor 20 mg one q.d. at supper. 4. Lopressor 25 one b.i.d. 5. Nitroglycerin 0.4 sublingual one sublingually every 5 minutes x3  p.r.n.    chest pain.  ACTIVITY:  The patient is cautioned against driving, heavy lifting, sex, and tub baths x2 days.  A note is provided for his work place that he may not return until he is seen by Dr. [name].  DIET RECOMMENDED:  Heart healthy diet.  WOUND CARE:  The patient agrees to call if his groin becomes hard or painful.  FOLLOW-UP:  He will follow up as scheduled with Dr. [name] on March 03, 2002  at 9 a.m.  He knows to call in the interim with any problems, questions, or concerns, or change or increase in symptoms.Dictated by:   Pennelope Bracken, N.P.  Attending Physician:  Lenoria Farrier DD:  02/23/02 TD:  02/23/02 Job: [id number]

## 2011-01-10 NOTE — Assessment & Plan Note (Signed)
[facility]                           GASTROENTEROLOGY OFFICE NOTE   NAME:JOHNSONArmani, [name]                      MRN:          [id number]  DATE:05/11/2006                            DOB:          May 29, 1946    Mr. [name] returns complaining of frequent hiccups, occasional heartburn  and intermittent dysphagia with liquids and solids.  He notes this  particularly with very cold or very hot liquids.  Occasionally he has  problems with meats, breads and other solids.  He has been taking Prilosec  OTC for several months.  He is status post upper endoscopy with biopsy and  dilation in December 2006.  He states he did have relief of his dysphagia  symptoms following the dilation.  He has no other gastrointestinal  complaints.   MEDICATIONS:  Listed on maintenance medication sheet.   MEDICATION ALLERGIES:  PLAVIX LEADING TO A RASH.   EXAM:  No acute distress.  Weight 206 pounds, blood pressure is 118/68,  pulse 64 and regular.  HEENT:  Anicteric sclerae.  Oropharynx clear.  CHEST:  Clear to auscultation bilaterally.  CARDIAC:  Regular rate and rhythm without murmurs appreciated.  ABDOMEN:  Soft and nontender with normoactive bowel sounds.   ASSESSMENT AND PLAN:  Gastroesophageal reflux disease with liquid and solid  dysphagia.  Rule out a component of spasm related to hot and cold liquids.  Rule out a recurrent peptic stricture, esophagitis and other disorders.  Discontinue Prilosec and begin Nexium 40 mg p.o. q.a.m.  Re-intensify all  antireflux measures.  Return office visit in 4-6 weeks and if his symptoms  have not substantially improved, will plan to proceed with upper endoscopy  with possible repeat dilation.                                   Venita Lick. Russella Dar, MD, [facility]   MTS/MedQ  DD:  05/11/2006  DT:  05/12/2006  Job #:  [id number]   cc:   Holley Bouche, M.D.

## 2011-01-10 NOTE — Cardiovascular Report (Signed)
[place]. [facility]  Patient:    NOBORU, BIDINGER Visit Number: [id number] MRN: [id number]          Service Type: MED Location: [phone] Attending Physician:  Lewayne Bunting Dictated by:   Everardo Beals [name], M.D. [facility] Proc. Date: 11/15/01 Admit Date:  11/10/2001   CC:         Arvella Merles, M.D.  Doylene Canning. Ladona Ridgel, M.D. [facility]  Cardiopulmonary Laboratory   Cardiac Catheterization  PROCEDURES PERFORMED: Percutaneous coronary intervention.  CLINICAL HISTORY: The patient is 65 years old and works in Production designer, theatre/television/film for [employer]. He had no prior history of heart disease and recently was admitted with chest pain and positive enzymes consistent with a non-Q-wave infarction. He underwent catheterization and intervention with stenting of the right coronary artery by Drs. Nishan and Pulsipher last week and returned for intervention on the circumflex artery.  DESCRIPTION OF PROCEDURE: The procedure was performed via the left femoral artery using an arterial sheath and a 7, 4.0 Voda guiding catheter with side holes. We used a Geologist, engineering. We crossed the lesion in the circumflex artery with the wire without difficulty. We initially dilated with a 2.75 x 20 mm Quantum Maverick performing a total of five inflations up to 12 atmospheres for 45 seconds in the mid circumflex, which was a lesion that expand almost 30 mm. This resulted in a small edge tear in the proximal portion. We originally hoped to treat this with balloon because it was a long lesion, but because of the tear, we elected to stent this. We used a 2.5 x 28 mm Pixel and this was just long enough to cover the lesion and we deployed this with one inflation of 13 atmospheres for 55 seconds. Repeat diagnostic studies were then performed through the guiding catheter.  We had initially taken a picture of the right coronary artery, and Dr. Gerri Spore and I reviewed the films and  debated whether to treat distal lesion in the right coronary artery which was not treated last time. The appearance was perhaps slightly worse and we decided to go ahead and treat the lesion. We used a JR4 guiding catheter with side holes and the same Forte wire. We crossed the lesion with the wire without difficulty. We direct stented with a 2.25 x 18 mm Pixel deploying this with one inflation of 16 atmospheres for 52 seconds. We then post-dilated with a 2.5 x 15 mm Quantum Maverick performing two inflations of 16 atmospheres for 32 and 21 seconds, avoiding the distal edge.  Repeat diagnostic studies were then performed through the guiding catheter.  Prior to the intervention, the patient had been given weight-adjusted heparin to prolong the ACT to greater than 200 seconds and was given a double bolus Integrilin and infusion. He had been on Plavix.  RESULTS: The circumflex artery was initially 90% narrowed focally with segmental disease of 50% over about a 25 mm segment. Following stenting this improved to less than 10%.  The right coronary artery lesion was initially two lesions that were 60 and 70%. Following stenting these improved to less than 10%.  CONCLUSIONS: 1. Successful stenting of the circumflex artery stenosis with improvement in    percent diameter narrowing from 90% to less than 10%. 2. Successful stenting of the distal right coronary artery stenosis with    improvement in percent diameter narrowing from 70% to less than 10%.  DISPOSITION: The patient was returned to the postangioplasty unit for further  observation. Dictated by:   Everardo Beals [name], M.D. [facility] Attending Physician:  Lewayne Bunting DD:  11/15/01 TD:  11/15/01 Job: [id number]

## 2011-05-20 ENCOUNTER — Other Ambulatory Visit: Payer: Self-pay | Admitting: Internal Medicine

## 2011-05-20 MED ORDER — LISINOPRIL 5 MG PO TABS: 5.0000 mg | ORAL_TABLET | Freq: Every day | ORAL | Status: DC

## 2011-08-20 ENCOUNTER — Other Ambulatory Visit: Payer: Self-pay

## 2011-08-20 MED ORDER — SIMVASTATIN 40 MG PO TABS: 40.0000 mg | ORAL_TABLET | Freq: Every day | ORAL | Status: DC

## 2011-09-10 ENCOUNTER — Encounter: Payer: Self-pay | Admitting: *Deleted

## 2011-09-11 ENCOUNTER — Ambulatory Visit (INDEPENDENT_AMBULATORY_CARE_PROVIDER_SITE_OTHER): Payer: Medicare Other | Admitting: Cardiology

## 2011-09-11 ENCOUNTER — Encounter: Payer: Self-pay | Admitting: Cardiology

## 2011-09-11 VITALS — BP 150/85 | HR 60 | Ht 72.0 in | Wt 210.0 lb

## 2011-09-11 DIAGNOSIS — I2510 Atherosclerotic heart disease of native coronary artery without angina pectoris: Secondary | ICD-10-CM

## 2011-09-11 DIAGNOSIS — E782 Mixed hyperlipidemia: Secondary | ICD-10-CM

## 2011-09-11 DIAGNOSIS — I10 Essential (primary) hypertension: Secondary | ICD-10-CM

## 2011-09-11 NOTE — Progress Notes (Signed)
PCP: Dr. Tiburcio Pea  66 yo with history of CAD s/p PCI as well as HTN presents for cardiology followup. He had a myoview in 3/11 with no evidence for ischemia or infarction. No symptomatic changes since last appointment. He does not have any significant chest pain. He does get some mild shortness of breath walking up a hill or an incline. BP is running high today but has not been elevated when he has checked it at home.   ECG: NSR, rSR', LAE   Labs (3/11): K 4.3, creatinine 0.9  Labs (8/11): LDL 80, HDL 31, LFTs normal  Labs (3/12): LDL 85, HDL 27, K 4.3, creatinine 0.7  Allergies:  1) ! Plavix (Clopidogrel Bisulfate)   Past Medical History:  1. CAD: Multiple PCIs. He has a pinched diagonal with 80-90% ostial stenosis. ETT-myoview (3/11): 8/03", no ST changes, hypertensive BP response, no evidence for ischemia or infarction, EF 66%.  2. Tremor  3. Hyperlipidemia with low HDL.  4. GERD.  5. HTN   Family History:  CAD   Social History:  non-smoker, non-drinker, no drug abuse  He is currently disabled  He is married   Current Outpatient Prescriptions  Medication Sig Dispense Refill  . aspirin 81 MG tablet Take 160 mg by mouth daily.      . isosorbide mononitrate (IMDUR) 60 MG 24 hr tablet Take 60 mg by mouth daily.      Marland Kitchen lisinopril (PRINIVIL,ZESTRIL) 5 MG tablet Take 1 tablet (5 mg total) by mouth daily.  90 tablet  3  . nitroGLYCERIN (NITROSTAT) 0.4 MG SL tablet Place 0.4 mg under the tongue every 5 (five) minutes as needed.      Marland Kitchen omeprazole (PRILOSEC) 20 MG capsule Take 20 mg by mouth daily.      . propranolol (INDERAL LA) 120 MG 24 hr capsule Take 120 mg by mouth daily.      . simvastatin (ZOCOR) 40 MG tablet Take 1 tablet (40 mg total) by mouth at bedtime.  90 tablet  1    BP 150/85  Pulse 60  Ht 6' (1.829 m)  Wt 95.255 kg (210 lb)  BMI 28.48 kg/m2 General: NAD Neck: No JVD, no thyromegaly or thyroid nodule.  Lungs: Clear to auscultation bilaterally with normal  respiratory effort. CV: Nondisplaced PMI.  Heart regular S1/S2, +S4, no murmur.  No peripheral edema.  No carotid bruit.  Normal pedal pulses.  Abdomen: Soft, nontender, no hepatosplenomegaly, no distention.  Neurologic: Alert and oriented x 3.  Psych: Normal affect. Extremities: No clubbing or cyanosis.

## 2011-09-11 NOTE — Patient Instructions (Addendum)
Your physician recommends that you return for a FASTING lipid profile /BMET 414.01   Take and record your blood pressure about 2 hours after you take you medication--I will call you in about 2 weeks and get the readings. Luana Shu [phone]  Your physician wants you to follow-up in: 1 year with Dr Shirlee Latch. (January 2014)You will receive a reminder letter in the mail two months in advance. If you don't receive a letter, please call our office to schedule the follow-up appointment.

## 2011-09-12 NOTE — Assessment & Plan Note (Addendum)
Stable with no ischemic symptoms.  Continue ASA, ACEI, statin, Imdur, beta blocker.

## 2011-09-12 NOTE — Assessment & Plan Note (Signed)
Check lipids/LFTs, goal LDL < 70.  

## 2011-09-12 NOTE — Assessment & Plan Note (Signed)
BP high today.  He says it is normal when he checks it outside the doctor's office.  Will have him check BP daily for the next 2 wks.  I will have my nurse call him to get the readings.  If BP running high, will increase ACEI.

## 2011-09-15 ENCOUNTER — Other Ambulatory Visit (INDEPENDENT_AMBULATORY_CARE_PROVIDER_SITE_OTHER): Payer: Medicare Other | Admitting: *Deleted

## 2011-09-15 DIAGNOSIS — I2510 Atherosclerotic heart disease of native coronary artery without angina pectoris: Secondary | ICD-10-CM

## 2011-09-15 LAB — LIPID PANEL
Cholesterol: 115 mg/dL (ref 0–200)
HDL: 30 mg/dL — ABNORMAL LOW (ref >39.00)
LDL Cholesterol: 70 mg/dL (ref 0–99)
Total CHOL/HDL Ratio: 4
Triglycerides: 77 mg/dL (ref 0.0–149.0)

## 2011-09-15 LAB — BASIC METABOLIC PANEL
BUN: 12 mg/dL (ref 6–23)
CO2: 27 mEq/L (ref 19–32)
Calcium: 8.9 mg/dL (ref 8.4–10.5)
Creatinine, Ser: 1.1 mg/dL (ref 0.4–1.5)

## 2011-09-29 ENCOUNTER — Telehealth: Payer: Self-pay | Admitting: *Deleted

## 2011-09-29 NOTE — Telephone Encounter (Signed)
Dr Shirlee Latch reviewed BP readings pt brought into office dated 09/11/11 to 09/24/11. BP OK no changes. Pt notified. BP readings signed by Dr Shirlee Latch forwarded to HIM.

## 2011-09-29 NOTE — Telephone Encounter (Signed)
HYPERTENSION, BENIGN - Tyler Ancona, MD 09/12/2011 10:24 AM Signed  BP high today. He says it is normal when he checks it outside the doctor's office. Will have him check BP daily for the next 2 wks. I will have my nurse call him to get the readings. If BP running high, will increase ACEI.   09/29/11--pt brought in BPs --these are on Dr Alford Highland cart for him to review.

## 2011-10-21 ENCOUNTER — Other Ambulatory Visit: Payer: Self-pay | Admitting: *Deleted

## 2011-10-21 MED ORDER — PROPRANOLOL HCL ER 120 MG PO CP24: 120.0000 mg | ORAL_CAPSULE | Freq: Every day | ORAL | Status: DC

## 2011-10-29 ENCOUNTER — Other Ambulatory Visit: Payer: Self-pay

## 2011-10-29 MED ORDER — ISOSORBIDE MONONITRATE ER 60 MG PO TB24: 60.0000 mg | ORAL_TABLET | Freq: Every day | ORAL | Status: DC

## 2012-05-02 ENCOUNTER — Other Ambulatory Visit: Payer: Self-pay | Admitting: Cardiology

## 2012-05-12 ENCOUNTER — Encounter: Payer: Self-pay | Admitting: Physician Assistant

## 2012-05-12 ENCOUNTER — Ambulatory Visit (INDEPENDENT_AMBULATORY_CARE_PROVIDER_SITE_OTHER): Payer: Medicare Other | Admitting: Physician Assistant

## 2012-05-12 VITALS — BP 110/78 | HR 50 | Ht 72.0 in | Wt 206.0 lb

## 2012-05-12 DIAGNOSIS — T7840XA Allergy, unspecified, initial encounter: Secondary | ICD-10-CM

## 2012-05-12 DIAGNOSIS — I10 Essential (primary) hypertension: Secondary | ICD-10-CM

## 2012-05-12 DIAGNOSIS — I2510 Atherosclerotic heart disease of native coronary artery without angina pectoris: Secondary | ICD-10-CM

## 2012-05-12 DIAGNOSIS — Z91013 Allergy to seafood: Secondary | ICD-10-CM | POA: Insufficient documentation

## 2012-05-12 NOTE — Addendum Note (Signed)
Addended by: Demetrios Loll on: 05/12/2012 09:38 AM   Modules accepted: Orders

## 2012-05-12 NOTE — Assessment & Plan Note (Signed)
Stable

## 2012-05-12 NOTE — Assessment & Plan Note (Signed)
Patient has what sounds like severe reaction to shellfish. I recommend he see an allergist for further testing since this is the second occurrence. I don't believe his symptoms are cardiac related. No further workup at this time.

## 2012-05-12 NOTE — Assessment & Plan Note (Signed)
Stable without chest pain 

## 2012-05-12 NOTE — Progress Notes (Signed)
HPI:  This is a 66 year old male patient of Dr. Sherlie Ban who has a history of coronary artery disease status post multiple C. PCI procedures and has stents in all 3 vessels. He has a diagonal branch which was jailed by the stent which has an 80-90% ostial stenosis. He has been stable without chest pain for many years.  The patient comes in today after an episode that sounds like an allergic reaction to shellfish. He was at a wedding and 8 sharp and grips followed by a shrimp appetizer. He had profuse nausea and vomiting for several hours associated with diaphoresis. The following day he was sore under his ribs from the excessive vomiting. His wife was concerned because of the profuse sweating. He had a similar reaction to shellfish about a year ago when visiting his son in [place]. He denies any chest tightness or pressure similar to when he had stents placed. He denies any exertional symptoms, dyspnea, dizziness, or presyncope. Patient is feeling much better.  Allergies:  -- Clopidogrel Bisulfate    --  REACTION: rash  Current Outpatient Prescriptions on File Prior to Visit: aspirin 81 MG tablet, Take 160 mg by mouth daily., Disp: , Rfl:  isosorbide mononitrate (IMDUR) 60 MG 24 hr tablet, Take 1 tablet (60 mg total) by mouth daily., Disp: 30 tablet, Rfl: 6 lisinopril (PRINIVIL,ZESTRIL) 5 MG tablet, Take 1 tablet (5 mg total) by mouth daily., Disp: 90 tablet, Rfl: 3 nitroGLYCERIN (NITROSTAT) 0.4 MG SL tablet, Place 0.4 mg under the tongue every 5 (five) minutes as needed., Disp: , Rfl:  omeprazole (PRILOSEC) 20 MG capsule, Take 20 mg by mouth daily., Disp: , Rfl:  propranolol (INDERAL LA) 120 MG 24 hr capsule, Take 1 capsule (120 mg total) by mouth daily., Disp: 30 capsule, Rfl: 6 simvastatin (ZOCOR) 40 MG tablet, TAKE 1 TABLET BY MOUTH AT BEDTIME, Disp: 90 tablet, Rfl: 1    Past Medical History:   Coronary artery disease                                        Comment:multiple PCIs. has had  pinched diagonal with               80-90% ostial stenosis,  ETT myoview (3/11):               8/03, no ST changes, hypertensive BP response,               no evidence for ischemia or infarction, EF 66%    HTN (hypertension)                                           Hyperlipidemia                                                 Comment:low HDL   Tremor  GERD (gastroesophageal reflux disease)                      No past surgical history on file.  Review of patient's family history indicates:   Coronary artery disease                                   Comment: family history   Social History   Marital Status: Married             Spouse Name:                      Years of Education:                 Number of children:             Occupational History   None on file  Social History Main Topics   Smoking Status: Never Smoker                     Smokeless Status: Never Used                       Alcohol Use: No             Drug Use: No             Sexual Activity: Not on file        Other Topics            Concern   None on file  Social History Narrative   Current disabled    ROS:   PHYSICAL EXAM: Well-nournished, in no acute distress. Neck: No JVD, HJR, Bruit, or thyroid enlargement  Lungs: No tachypnea, clear without wheezing, rales, or rhonchi  Cardiovascular: RRR, PMI not displaced, heart sounds normal, no murmurs, gallops, bruit, thrill, or heave.  Abdomen: BS normal. Soft without organomegaly, masses, lesions or tenderness.  Extremities: without cyanosis, clubbing or edema. Good distal pulses bilateral  SKin: Warm, no lesions or rashes   Musculoskeletal: No deformities  Neuro: no focal signs  BP 110/78  Pulse 50  Ht 6' (1.829 m)  Wt 206 lb (93.441 kg)  BMI 27.94 kg/m2    NWG:NFAOZ bradycardia at 50 beats from an incomplete right bundle branch block.No change from prior tracing.

## 2012-05-12 NOTE — Patient Instructions (Signed)
Your physician wants you to follow-up in: January. You will receive a reminder letter in the mail two months in advance. If you don't receive a letter, please call our office to schedule the follow-up appointment.  Your physician recommends that you continue on your current medications as directed. Please refer to the Current Medication list given to you today.  Your provider is referring you  to an allergy specialist, we will arrange

## 2012-05-13 ENCOUNTER — Telehealth: Payer: Self-pay | Admitting: *Deleted

## 2012-05-13 NOTE — Telephone Encounter (Signed)
Allergy       [name]    MRN: [id number] DOB: 01-Nov-1945    Pt Home: [phone]               Message     06-09-12 @ 2pm with Dr. Madie Reno with [facility]

## 2012-05-20 ENCOUNTER — Other Ambulatory Visit: Payer: Self-pay | Admitting: Cardiology

## 2012-05-20 ENCOUNTER — Other Ambulatory Visit: Payer: Self-pay | Admitting: *Deleted

## 2012-05-20 MED ORDER — ISOSORBIDE MONONITRATE ER 60 MG PO TB24: 60.0000 mg | ORAL_TABLET | Freq: Every day | ORAL | Status: DC

## 2012-05-20 NOTE — Telephone Encounter (Signed)
Refilled propranolol

## 2012-07-19 ENCOUNTER — Other Ambulatory Visit: Payer: Self-pay

## 2012-07-19 MED ORDER — LISINOPRIL 5 MG PO TABS: 5.0000 mg | ORAL_TABLET | Freq: Every day | ORAL | Status: DC

## 2012-07-19 NOTE — Telephone Encounter (Signed)
Refill sent for lisinopril 5 mg  

## 2012-09-09 ENCOUNTER — Encounter (INDEPENDENT_AMBULATORY_CARE_PROVIDER_SITE_OTHER): Payer: Self-pay | Admitting: General Surgery

## 2012-09-13 ENCOUNTER — Encounter (INDEPENDENT_AMBULATORY_CARE_PROVIDER_SITE_OTHER): Payer: Self-pay | Admitting: General Surgery

## 2012-09-16 ENCOUNTER — Ambulatory Visit (INDEPENDENT_AMBULATORY_CARE_PROVIDER_SITE_OTHER): Payer: Medicare Other | Admitting: General Surgery

## 2012-09-16 ENCOUNTER — Encounter (INDEPENDENT_AMBULATORY_CARE_PROVIDER_SITE_OTHER): Payer: Self-pay | Admitting: General Surgery

## 2012-09-16 ENCOUNTER — Other Ambulatory Visit (INDEPENDENT_AMBULATORY_CARE_PROVIDER_SITE_OTHER): Payer: Self-pay | Admitting: General Surgery

## 2012-09-16 VITALS — BP 118/70 | HR 60 | Resp 16 | Ht 72.0 in | Wt 205.2 lb

## 2012-09-16 DIAGNOSIS — K429 Umbilical hernia without obstruction or gangrene: Secondary | ICD-10-CM

## 2012-09-16 NOTE — Progress Notes (Signed)
Patient ID: RAYNE LOISEAU, male   DOB: Jan 13, 1946, 67 y.o.   MRN: [id number]  Chief Complaint  Patient presents with  . New Evaluation    eval umb hernia    HPI MARSHA HILLMAN is a 67 y.o. male.  This is a 67 year old man was referred by Dr. Tiburcio Pea for an umbilical hernia evaluation. The patient states that his brother for possibly a year. He states it is getting bigger and is becoming more tender over this period of time. The patient has no signs or symptoms of intestinal obstruction. Patient has had previous stents in the past and see Dr. Sherlie Ban cardiologist. HPI  Past Medical History  Diagnosis Date  . Coronary artery disease     multiple PCIs. has had pinched diagonal with 80-90% ostial stenosis,  ETT myoview (3/11): 8/03, no ST changes, hypertensive BP response, no evidence for ischemia or infarction, EF 66%     . HTN (hypertension)   . Hyperlipidemia     low HDL  . Tremor   . GERD (gastroesophageal reflux disease)   . Arthritis   . Myocardial infarction   . Neuromuscular disorder     Past Surgical History  Procedure Date  . Hernia repair   . Carotid stent 2003  . Coronary angioplasty with stent placement 2003    Family History  Problem Relation Age of Onset  . Coronary artery disease      family history  . Stroke Mother   . Kidney disease Mother   . Diabetes Mother   . Cancer Paternal Uncle     larynx    Social History History  Substance Use Topics  . Smoking status: Former Smoker    Quit date: 12/24/1975  . Smokeless tobacco: Never Used  . Alcohol Use: Yes     Comment: very seldom    Allergies  Allergen Reactions  . Shellfish Allergy Nausea And Vomiting  . Clopidogrel Bisulfate     REACTION: rash    Current Outpatient Prescriptions  Medication Sig Dispense Refill  . aspirin 81 MG tablet Take 160 mg by mouth daily.      Marland Kitchen aspirin 81 MG tablet Take 81 mg by mouth daily.      . isosorbide mononitrate (IMDUR) 60 MG 24 hr tablet Take 1 tablet (60  mg total) by mouth daily.  30 tablet  9  . lisinopril (PRINIVIL,ZESTRIL) 5 MG tablet Take 1 tablet (5 mg total) by mouth daily.  90 tablet  3  . nitroGLYCERIN (NITROSTAT) 0.4 MG SL tablet Place 0.4 mg under the tongue every 5 (five) minutes as needed.      Marland Kitchen omeprazole (PRILOSEC) 20 MG capsule Take 20 mg by mouth daily.      . Omeprazole Magnesium 20.6 (20 BASE) MG CPDR Take 20.6 mg by mouth daily.      . Probiotic Product (PROBIOTIC DAILY) CAPS Take by mouth.      . propranolol ER (INDERAL LA) 120 MG 24 hr capsule TAKE ONE CAPSULE BY MOUTH DAILY  30 capsule  5  . simvastatin (ZOCOR) 40 MG tablet TAKE 1 TABLET BY MOUTH AT BEDTIME  90 tablet  1    Review of Systems Review of Systems  Constitutional: Negative.   HENT: Negative.   Respiratory: Negative.   Cardiovascular: Negative.   Gastrointestinal: Positive for abdominal pain (at umb).  Neurological: Negative.     Blood pressure 118/70, pulse 60, resp. rate 16, height 6' (1.829 m), weight 205 lb 3.2 oz (93.078  kg).  Physical Exam Physical Exam  Constitutional: He is oriented to person, place, and time. He appears well-developed and well-nourished.  HENT:  Head: Normocephalic and atraumatic.  Eyes: Conjunctivae normal and EOM are normal. Pupils are equal, round, and reactive to light.  Neck: Normal range of motion. Neck supple.  Cardiovascular: Normal rate and normal heart sounds.   Pulmonary/Chest: Effort normal and breath sounds normal.  Abdominal: Soft. Bowel sounds are normal.    Musculoskeletal: Normal range of motion.  Neurological: He is alert and oriented to person, place, and time.    Data Reviewed none  Assessment    67 year old male with an umbilical hernia, with incarcerated omentum.    Plan    1. The patient sees Dr. Sherlie Ban his cardiologist. He will need cardiac clearance prior to scheduling of his umbilical hernia. 2. Scheduled the patient for laparoscopic umbilical hernia repair with mesh.  3. All risks  and benefits were discussed with the patient, to generally include infection, bleeding, damage to surrounding structures, and recurrence. Alternatives were offered and described.  All questions were answered and the patient voiced understanding of the procedure and wishes to proceed at this point.        Marigene Ehlers., Valeska Haislip 09/16/2012, 9:06 AM

## 2012-09-20 ENCOUNTER — Telehealth: Payer: Self-pay | Admitting: *Deleted

## 2012-09-20 NOTE — Telephone Encounter (Signed)
[name], MD       Sent:  Mon September 20, 2012 10:14 AM   To:  Tyler Krauss, RN    Please call patient, OK for surgery if no new symptoms (chest pain, dyspnea).  . 09/20/12--Spoke with pt. Pt has appt with Dr Shirlee Latch 09/27/12 prior to surgery. Pt states that he is not having any new symptoms.

## 2012-09-21 ENCOUNTER — Encounter: Payer: Self-pay | Admitting: *Deleted

## 2012-09-27 ENCOUNTER — Encounter: Payer: Self-pay | Admitting: Cardiology

## 2012-09-27 ENCOUNTER — Ambulatory Visit (INDEPENDENT_AMBULATORY_CARE_PROVIDER_SITE_OTHER): Payer: Medicare PPO | Admitting: Cardiology

## 2012-09-27 ENCOUNTER — Telehealth (INDEPENDENT_AMBULATORY_CARE_PROVIDER_SITE_OTHER): Payer: Self-pay | Admitting: General Surgery

## 2012-09-27 VITALS — BP 128/68 | HR 57 | Ht 72.0 in | Wt 210.0 lb

## 2012-09-27 DIAGNOSIS — I2510 Atherosclerotic heart disease of native coronary artery without angina pectoris: Secondary | ICD-10-CM

## 2012-09-27 DIAGNOSIS — E785 Hyperlipidemia, unspecified: Secondary | ICD-10-CM

## 2012-09-27 DIAGNOSIS — I10 Essential (primary) hypertension: Secondary | ICD-10-CM

## 2012-09-27 NOTE — Patient Instructions (Addendum)
Your physician has requested that you have an echocardiogram. Echocardiography is a painless test that uses sound waves to create images of your heart. It provides your doctor with information about the size and shape of your heart and how well your heart's chambers and valves are working. This procedure takes approximately one hour. There are no restrictions for this procedure.  Your physician wants you to follow-up in: 1 year with Dr Shirlee Latch. (February 2015) You will receive a reminder letter in the mail two months in advance. If you don't receive a letter, please call our office to schedule the follow-up appointment.

## 2012-09-27 NOTE — Telephone Encounter (Signed)
LMOM returning patient's call to answer some questions before scheduling surgery.Marland Kitchenit was after 5 so they will return the call on or after 09/28/12 to [facility]

## 2012-09-27 NOTE — Progress Notes (Signed)
Patient ID: [name], male   DOB: November 22, 1945, 67 y.o.   MRN: [id number] PCP: Dr. Tiburcio Pea  67 yo with history of CAD s/p PCI as well as HTN presents for cardiology followup. He had a myoview in 3/11 with no evidence for ischemia or infarction.  He does not have any significant chest pain. He does get some mild shortness of breath walking up a hill. BP is under control.  He is not getting much exercise.  He feels like he has more fatigue than in the past.  He is planning to have an umbilical hernia repaired in the near future.  ECG: NSR, rSR', inferior Qs  Labs (3/11): K 4.3, creatinine 0.9  Labs (8/11): LDL 80, HDL 31, LFTs normal  Labs (3/12): LDL 85, HDL 27, K 4.3, creatinine 0.7 Labs (1/14): LDL 70, HDL 30, K 4.4, creatinine 1.1  Allergies:  1) ! Plavix (Clopidogrel Bisulfate)   Past Medical History:  1. CAD: Multiple PCIs. He has a pinched diagonal with 80-90% ostial stenosis. ETT-myoview (3/11): 8/03", no ST changes, hypertensive BP response, no evidence for ischemia or infarction, EF 66%.  2. Tremor  3. Hyperlipidemia with low HDL.  4. GERD.  5. HTN  6. Shellfish allergy  Family History:  CAD   Social History:  non-smoker, non-drinker, no drug abuse  He is disabled  He is married   ROS: All systems reviewed and negative except as per HPI.  Current Outpatient Prescriptions  Medication Sig Dispense Refill  . aspirin 81 MG tablet Take 160 mg by mouth daily.      Marland Kitchen aspirin 81 MG tablet Take 81 mg by mouth daily.      . isosorbide mononitrate (IMDUR) 60 MG 24 hr tablet Take 1 tablet (60 mg total) by mouth daily.  30 tablet  9  . lisinopril (PRINIVIL,ZESTRIL) 5 MG tablet Take 1 tablet (5 mg total) by mouth daily.  90 tablet  3  . nitroGLYCERIN (NITROSTAT) 0.4 MG SL tablet Place 0.4 mg under the tongue every 5 (five) minutes as needed.      Marland Kitchen omeprazole (PRILOSEC) 20 MG capsule Take 20 mg by mouth daily.      . Omeprazole Magnesium 20.6 (20 BASE) MG CPDR Take 20.6 mg by  mouth daily.      . Probiotic Product (PROBIOTIC DAILY) CAPS Take by mouth.      . propranolol ER (INDERAL LA) 120 MG 24 hr capsule TAKE ONE CAPSULE BY MOUTH DAILY  30 capsule  5  . simvastatin (ZOCOR) 40 MG tablet TAKE 1 TABLET BY MOUTH AT BEDTIME  90 tablet  1    BP 128/68  Pulse 57  Ht 6' (1.829 m)  Wt 210 lb (95.255 kg)  BMI 28.48 kg/m2 General: NAD Neck: No JVD, no thyromegaly or thyroid nodule.  Lungs: Clear to auscultation bilaterally with normal respiratory effort. CV: Nondisplaced PMI.  Heart regular S1/S2, +S4, no murmur.  No peripheral edema.  No carotid bruit.  Normal pedal pulses.  Abdomen: Soft, nontender, no hepatosplenomegaly, no distention.  Neurologic: Alert and oriented x 3.  Psych: Normal affect Extremities: No clubbing or cyanosis.   Assessment/Plan:  CAD, NATIVE VESSEL  Stable with no ischemic symptoms.  He notes some generalized fatigue.  Continue ASA, ACEI, statin, Imdur, beta blocker.  Given the fatigue and the planned surgery, will get an echo to make sure that LV systolic function remains normal.  MIXED HYPERLIPIDEMIA  Continue statin, good lipids when checked this month.  HYPERTENSION BP is controlled.  Pre-operative Evaluation Patient will be undergoing umbilical hernia repair.  I am going to get an echo to make sure that LV systolic function is preserved.  No ischemic symptoms.  He is of reasonable risk to undergo surgery and should take his beta blocker peri-operatively.  Marca Ancona 09/27/2012 9:56 AM

## 2012-09-28 NOTE — Telephone Encounter (Signed)
Called and spoke with patient's wife who was wanting to see if the patient could go ahead and get on the books before we had Cardiac clearance and i had told her that per protocol we could not schd anything without clearance.Marland Kitchenand she was also wanting to just get an idea of AR schd and I called Debbie in surgery scheduling who stated that she could not tell her dates because the dates that she told her could be taken and not available come time to schd patient's surgery.patient's wife understood and was ok with this

## 2012-09-30 ENCOUNTER — Ambulatory Visit (HOSPITAL_COMMUNITY): Payer: Medicare PPO | Attending: Cardiology | Admitting: Radiology

## 2012-09-30 DIAGNOSIS — R0609 Other forms of dyspnea: Secondary | ICD-10-CM | POA: Insufficient documentation

## 2012-09-30 DIAGNOSIS — R0989 Other specified symptoms and signs involving the circulatory and respiratory systems: Secondary | ICD-10-CM | POA: Insufficient documentation

## 2012-09-30 DIAGNOSIS — E785 Hyperlipidemia, unspecified: Secondary | ICD-10-CM | POA: Insufficient documentation

## 2012-09-30 DIAGNOSIS — I10 Essential (primary) hypertension: Secondary | ICD-10-CM | POA: Insufficient documentation

## 2012-09-30 DIAGNOSIS — I2510 Atherosclerotic heart disease of native coronary artery without angina pectoris: Secondary | ICD-10-CM

## 2012-09-30 DIAGNOSIS — Z01810 Encounter for preprocedural cardiovascular examination: Secondary | ICD-10-CM | POA: Insufficient documentation

## 2012-09-30 DIAGNOSIS — I080 Rheumatic disorders of both mitral and aortic valves: Secondary | ICD-10-CM | POA: Insufficient documentation

## 2012-09-30 NOTE — Progress Notes (Signed)
Echocardiogram performed.  

## 2012-10-04 ENCOUNTER — Telehealth: Payer: Self-pay | Admitting: Cardiology

## 2012-10-04 NOTE — Telephone Encounter (Signed)
Follow Up    Pts wife is following up on echo results and if results have been sent to [place]. Would like to speak to nurse.

## 2012-10-04 NOTE — Telephone Encounter (Signed)
Spoke with pt wife, aware of echo results. Results forwarded to dr Shelle Iron.

## 2012-10-11 ENCOUNTER — Telehealth (INDEPENDENT_AMBULATORY_CARE_PROVIDER_SITE_OTHER): Payer: Self-pay | Admitting: General Surgery

## 2012-10-11 ENCOUNTER — Telehealth: Payer: Self-pay | Admitting: Cardiology

## 2012-10-11 ENCOUNTER — Encounter (INDEPENDENT_AMBULATORY_CARE_PROVIDER_SITE_OTHER): Payer: Self-pay

## 2012-10-11 NOTE — Telephone Encounter (Signed)
New problem    Need to hold asa 5 days. Surgery pending.  Status of cardiac clearance

## 2012-10-11 NOTE — Telephone Encounter (Signed)
Called Dr.McLean's office to req medical clearance...spoke with Marlowe Kays at11:53 10/11/12...she stated that their office had been closed

## 2012-10-11 NOTE — Telephone Encounter (Signed)
Follow up on call    Wife calling stating she waiting for results.

## 2012-10-11 NOTE — Telephone Encounter (Signed)
**Note De-Identified Euan Wandler Obfuscation** Cardiac clearance letter faxed through Epic to Dr. Debby Freiberg and June Leap in the same office.

## 2012-10-12 ENCOUNTER — Telehealth (INDEPENDENT_AMBULATORY_CARE_PROVIDER_SITE_OTHER): Payer: Self-pay | Admitting: General Surgery

## 2012-10-12 ENCOUNTER — Telehealth: Payer: Self-pay | Admitting: Cardiology

## 2012-10-12 NOTE — Telephone Encounter (Signed)
Spoke with Britta Mccreedy at [facility].

## 2012-10-12 NOTE — Telephone Encounter (Signed)
Spoke with pts wife

## 2012-10-12 NOTE — Telephone Encounter (Signed)
She is aware of Dr Alford Highland recommendations.

## 2012-10-12 NOTE — Telephone Encounter (Signed)
She will forward this information to Dr Derrell Lolling.

## 2012-10-12 NOTE — Telephone Encounter (Signed)
Dr. Lucien Mons has seen this pt (on 09/27/12) and given cardiac clearance.  Dr. Lucien Mons would prefer the ASA be held only 3 days, instead of the routine 5 days, if possible.  Please go forward with scheduling surgery.

## 2012-10-12 NOTE — Telephone Encounter (Signed)
New problem    Need something on paper stating patient ok to hold asa  5 days prior to surgery. Is ok to go under general anest.

## 2012-10-12 NOTE — Telephone Encounter (Signed)
I will forward to Dr McLean for review 

## 2012-10-12 NOTE — Telephone Encounter (Signed)
LM for pt's wife to let her know I had spoken with Britta Mccreedy and Dr Derrell Lolling has the information.

## 2012-10-12 NOTE — Telephone Encounter (Signed)
See my last note.  He is of acceptable risk to undergo surgery.  He can hold ASA prior, would prefer only 3 days but 5 days if necessary.

## 2012-10-13 ENCOUNTER — Telehealth (INDEPENDENT_AMBULATORY_CARE_PROVIDER_SITE_OTHER): Payer: Self-pay | Admitting: General Surgery

## 2012-10-13 NOTE — Telephone Encounter (Signed)
Is 3 days ok or do you want the full 5 days to hold ASA

## 2012-10-13 NOTE — Telephone Encounter (Signed)
Called patient's home and cell phone numbers to inform patient to hold ASA 3 days prior to surgery on Monday...did not get an answer from either phone number so I LMOM on cell and home numbers

## 2012-10-13 NOTE — Telephone Encounter (Signed)
Per staff message from AR it is ok to hold ASA 3 days.Marland Kitchen

## 2012-10-14 ENCOUNTER — Encounter (HOSPITAL_COMMUNITY): Payer: Self-pay | Admitting: Pharmacy Technician

## 2012-10-15 ENCOUNTER — Telehealth (INDEPENDENT_AMBULATORY_CARE_PROVIDER_SITE_OTHER): Payer: Self-pay | Admitting: General Surgery

## 2012-10-15 ENCOUNTER — Encounter (HOSPITAL_COMMUNITY): Payer: Self-pay

## 2012-10-15 NOTE — Telephone Encounter (Signed)
Spoke with [name] to advise her husband to hold ASA 3 days prior and she is aware an stated that she had already done so...patient was wondering if patient would need any kind of assistance after surgery getting up out of chairs and things of that nature and I advised her that he should not do any heavy lifting, pushing pulling, until we see him back and as long as he isn't taking any pain meds he should be fine to drive and that he shouldn't need any type of assistance...she was ok with this

## 2012-10-17 MED ORDER — CEFAZOLIN SODIUM-DEXTROSE 2-3 GM-% IV SOLR
2.0000 g | INTRAVENOUS | Status: AC
  Administered 2012-10-18: 2 g via INTRAVENOUS
  Filled 2012-10-17: qty 50

## 2012-10-18 ENCOUNTER — Encounter (HOSPITAL_COMMUNITY): Payer: Self-pay | Admitting: *Deleted

## 2012-10-18 ENCOUNTER — Encounter (HOSPITAL_COMMUNITY)
Admission: RE | Disposition: A | Discharge status: Home / Self Care | Payer: Self-pay | Source: Ambulatory Visit | Attending: General Surgery

## 2012-10-18 ENCOUNTER — Ambulatory Visit (HOSPITAL_COMMUNITY): Payer: Medicare PPO

## 2012-10-18 ENCOUNTER — Ambulatory Visit (HOSPITAL_COMMUNITY)
Admission: RE | Admit: 2012-10-18 | Discharge: 2012-10-18 | Disposition: A | Discharge status: Home / Self Care | Payer: Medicare PPO | Source: Ambulatory Visit | Attending: General Surgery | Admitting: General Surgery

## 2012-10-18 ENCOUNTER — Ambulatory Visit (HOSPITAL_COMMUNITY): Payer: Medicare PPO | Admitting: *Deleted

## 2012-10-18 DIAGNOSIS — I10 Essential (primary) hypertension: Secondary | ICD-10-CM | POA: Insufficient documentation

## 2012-10-18 DIAGNOSIS — K420 Umbilical hernia with obstruction, without gangrene: Secondary | ICD-10-CM

## 2012-10-18 HISTORY — DX: Bronchitis, not specified as acute or chronic: J40

## 2012-10-18 HISTORY — PX: UMBILICAL HERNIA REPAIR: SHX196

## 2012-10-18 HISTORY — PX: INSERTION OF MESH: SHX5868

## 2012-10-18 HISTORY — PX: HERNIA REPAIR: SHX51

## 2012-10-18 LAB — SURGICAL PCR SCREEN: Staphylococcus aureus: NEGATIVE

## 2012-10-18 LAB — CBC
HCT: 42.4 % (ref 39.0–52.0)
Platelets: 176 10*3/uL (ref 150–400)
RDW: 12.1 % (ref 11.5–15.5)
WBC: 5.9 10*3/uL (ref 4.0–10.5)

## 2012-10-18 LAB — BASIC METABOLIC PANEL
Chloride: 107 mEq/L (ref 96–112)
Creatinine, Ser: 1.02 mg/dL (ref 0.50–1.35)
GFR calc Af Amer: 87 mL/min — ABNORMAL LOW (ref >90)
Sodium: 144 mEq/L (ref 135–145)

## 2012-10-18 SURGERY — REPAIR, HERNIA, UMBILICAL, LAPAROSCOPIC
Anesthesia: General | Site: Abdomen | Wound class: Clean

## 2012-10-18 MED ORDER — PROPRANOLOL HCL ER 120 MG PO CP24
120.0000 mg | ORAL_CAPSULE | Freq: Once | ORAL | Status: AC
  Administered 2012-10-18: 120 mg via ORAL
  Filled 2012-10-18: qty 1

## 2012-10-18 MED ORDER — GLYCOPYRROLATE 0.2 MG/ML IJ SOLN
INTRAMUSCULAR | Status: DC | PRN
  Administered 2012-10-18: .6 mg via INTRAVENOUS

## 2012-10-18 MED ORDER — OXYCODONE-ACETAMINOPHEN 5-325 MG PO TABS: 1.0000 | ORAL_TABLET | ORAL | Status: DC | PRN

## 2012-10-18 MED ORDER — HYDROMORPHONE HCL PF 1 MG/ML IJ SOLN
INTRAMUSCULAR | Status: AC
  Administered 2012-10-18: 0.5 mg via INTRAVENOUS
  Filled 2012-10-18: qty 1

## 2012-10-18 MED ORDER — ACETAMINOPHEN 325 MG PO TABS: 650.0000 mg | ORAL_TABLET | ORAL | Status: DC | PRN

## 2012-10-18 MED ORDER — SODIUM CHLORIDE 0.9 % IJ SOLN: 3.0000 mL | INTRAMUSCULAR | Status: DC | PRN

## 2012-10-18 MED ORDER — PROPOFOL 10 MG/ML IV BOLUS
INTRAVENOUS | Status: DC | PRN
  Administered 2012-10-18: 200 mg via INTRAVENOUS

## 2012-10-18 MED ORDER — FENTANYL CITRATE 0.05 MG/ML IJ SOLN
INTRAMUSCULAR | Status: DC | PRN
  Administered 2012-10-18: 50 ug via INTRAVENOUS
  Administered 2012-10-18: 100 ug via INTRAVENOUS
  Administered 2012-10-18 (×3): 50 ug via INTRAVENOUS

## 2012-10-18 MED ORDER — SODIUM CHLORIDE 0.9 % IJ SOLN: 3.0000 mL | Freq: Two times a day (BID) | INTRAMUSCULAR | Status: DC

## 2012-10-18 MED ORDER — OXYCODONE HCL 5 MG PO TABS: 5.0000 mg | ORAL_TABLET | ORAL | Status: DC | PRN

## 2012-10-18 MED ORDER — MUPIROCIN 2 % EX OINT
TOPICAL_OINTMENT | CUTANEOUS | Status: AC
  Filled 2012-10-18: qty 22

## 2012-10-18 MED ORDER — OXYCODONE HCL 5 MG PO TABS
ORAL_TABLET | ORAL | Status: AC
  Administered 2012-10-18: 5 mg via ORAL
  Filled 2012-10-18: qty 1

## 2012-10-18 MED ORDER — NEOSTIGMINE METHYLSULFATE 1 MG/ML IJ SOLN
INTRAMUSCULAR | Status: DC | PRN
  Administered 2012-10-18: 5 mg via INTRAVENOUS

## 2012-10-18 MED ORDER — ACETAMINOPHEN 650 MG RE SUPP: 650.0000 mg | RECTAL | Status: DC | PRN

## 2012-10-18 MED ORDER — BUPIVACAINE HCL (PF) 0.25 % IJ SOLN
INTRAMUSCULAR | Status: DC | PRN
  Administered 2012-10-18: 7 mL

## 2012-10-18 MED ORDER — BUPIVACAINE HCL (PF) 0.25 % IJ SOLN
INTRAMUSCULAR | Status: AC
  Filled 2012-10-18: qty 30

## 2012-10-18 MED ORDER — ONDANSETRON HCL 4 MG/2ML IJ SOLN: 4.0000 mg | Freq: Four times a day (QID) | INTRAMUSCULAR | Status: DC | PRN

## 2012-10-18 MED ORDER — LIDOCAINE HCL (CARDIAC) 20 MG/ML IV SOLN
INTRAVENOUS | Status: DC | PRN
  Administered 2012-10-18: 80 mg via INTRAVENOUS

## 2012-10-18 MED ORDER — LACTATED RINGERS IV SOLN
INTRAVENOUS | Status: DC | PRN
  Administered 2012-10-18 (×2): via INTRAVENOUS

## 2012-10-18 MED ORDER — ACETAMINOPHEN 325 MG PO TABS
ORAL_TABLET | ORAL | Status: AC
  Administered 2012-10-18: 650 mg via ORAL
  Filled 2012-10-18: qty 2

## 2012-10-18 MED ORDER — ONDANSETRON HCL 4 MG/2ML IJ SOLN
INTRAMUSCULAR | Status: DC | PRN
  Administered 2012-10-18: 4 mg via INTRAVENOUS

## 2012-10-18 MED ORDER — SODIUM CHLORIDE 0.9 % IV SOLN: 250.0000 mL | INTRAVENOUS | Status: DC | PRN

## 2012-10-18 MED ORDER — ROCURONIUM BROMIDE 100 MG/10ML IV SOLN
INTRAVENOUS | Status: DC | PRN
  Administered 2012-10-18: 40 mg via INTRAVENOUS

## 2012-10-18 MED ORDER — CHLORHEXIDINE GLUCONATE 4 % EX LIQD: 1.0000 "application " | Freq: Once | CUTANEOUS | Status: DC

## 2012-10-18 MED ORDER — HYDROMORPHONE HCL PF 1 MG/ML IJ SOLN
0.2500 mg | INTRAMUSCULAR | Status: DC | PRN
  Administered 2012-10-18 (×2): 0.5 mg via INTRAVENOUS

## 2012-10-18 MED ORDER — MIDAZOLAM HCL 5 MG/5ML IJ SOLN
INTRAMUSCULAR | Status: DC | PRN
  Administered 2012-10-18: 2 mg via INTRAVENOUS

## 2012-10-18 MED ORDER — MUPIROCIN 2 % EX OINT
TOPICAL_OINTMENT | Freq: Once | CUTANEOUS | Status: AC
  Administered 2012-10-18: 10:00:00 via NASAL

## 2012-10-18 MED ORDER — 0.9 % SODIUM CHLORIDE (POUR BTL) OPTIME
TOPICAL | Status: DC | PRN
  Administered 2012-10-18: 1000 mL

## 2012-10-18 MED ORDER — LACTATED RINGERS IV SOLN
INTRAVENOUS | Status: DC
  Administered 2012-10-18: 12:00:00 via INTRAVENOUS

## 2012-10-18 SURGICAL SUPPLY — 54 items
APL SKNCLS STERI-STRIP NONHPOA (GAUZE/BANDAGES/DRESSINGS) ×1
APPLIER CLIP LOGIC TI 5 (MISCELLANEOUS) IMPLANT
APPLIER CLIP ROT 10 11.4 M/L (STAPLE)
APR CLP MED LRG 11.4X10 (STAPLE)
APR CLP MED LRG 33X5 (MISCELLANEOUS)
BENZOIN TINCTURE PRP APPL 2/3 (GAUZE/BANDAGES/DRESSINGS) ×2 IMPLANT
BINDER ABD UNIV 12 45-62 (WOUND CARE) IMPLANT
BINDER ABDOMINAL 46IN 62IN (WOUND CARE)
BLADE SURG ROTATE 9660 (MISCELLANEOUS) IMPLANT
CANISTER SUCTION 2500CC (MISCELLANEOUS) IMPLANT
CHLORAPREP W/TINT 26ML (MISCELLANEOUS) ×2 IMPLANT
CLIP APPLIE ROT 10 11.4 M/L (STAPLE) IMPLANT
CLOTH BEACON ORANGE TIMEOUT ST (SAFETY) ×2 IMPLANT
COVER SURGICAL LIGHT HANDLE (MISCELLANEOUS) ×2 IMPLANT
DEVICE SECURE STRAP 25 ABSORB (INSTRUMENTS) ×2 IMPLANT
DEVICE TROCAR PUNCTURE CLOSURE (ENDOMECHANICALS) ×2 IMPLANT
DRAPE UTILITY 15X26 W/TAPE STR (DRAPE) ×4 IMPLANT
ELECT REM PT RETURN 9FT ADLT (ELECTROSURGICAL) ×2
ELECTRODE REM PT RTRN 9FT ADLT (ELECTROSURGICAL) ×1 IMPLANT
GAUZE SPONGE 2X2 8PLY STRL LF (GAUZE/BANDAGES/DRESSINGS) ×1 IMPLANT
GLOVE BIO SURGEON STRL SZ7.5 (GLOVE) ×4 IMPLANT
GLOVE BIOGEL PI IND STRL 6 (GLOVE) ×1 IMPLANT
GLOVE BIOGEL PI IND STRL 6.5 (GLOVE) ×1 IMPLANT
GLOVE BIOGEL PI IND STRL 7.5 (GLOVE) ×1 IMPLANT
GLOVE BIOGEL PI INDICATOR 6 (GLOVE) ×1
GLOVE BIOGEL PI INDICATOR 6.5 (GLOVE) ×1
GLOVE BIOGEL PI INDICATOR 7.5 (GLOVE) ×1
GLOVE SURG SS PI 6.5 STRL IVOR (GLOVE) ×2 IMPLANT
GOWN STRL NON-REIN LRG LVL3 (GOWN DISPOSABLE) ×4 IMPLANT
GOWN STRL REIN XL XLG (GOWN DISPOSABLE) ×2 IMPLANT
KIT BASIN OR (CUSTOM PROCEDURE TRAY) ×2 IMPLANT
KIT ROOM TURNOVER OR (KITS) ×2 IMPLANT
MARKER SKIN DUAL TIP RULER LAB (MISCELLANEOUS) IMPLANT
MESH PARIETEX 4.7 (Mesh General) ×2 IMPLANT
NEEDLE INSUFFLATION 14GA 120MM (NEEDLE) ×2 IMPLANT
NEEDLE SPNL 22GX3.5 QUINCKE BK (NEEDLE) ×2 IMPLANT
NS IRRIG 1000ML POUR BTL (IV SOLUTION) ×2 IMPLANT
PAD ARMBOARD 7.5X6 YLW CONV (MISCELLANEOUS) ×4 IMPLANT
SCISSORS LAP 5X35 DISP (ENDOMECHANICALS) ×2 IMPLANT
SET IRRIG TUBING LAPAROSCOPIC (IRRIGATION / IRRIGATOR) IMPLANT
SLEEVE ENDOPATH XCEL 5M (ENDOMECHANICALS) ×2 IMPLANT
SPONGE GAUZE 2X2 STER 10/PKG (GAUZE/BANDAGES/DRESSINGS) ×1
SPONGE GAUZE 4X4 12PLY (GAUZE/BANDAGES/DRESSINGS) ×2 IMPLANT
SUT CHROMIC 2 0 SH (SUTURE) ×2 IMPLANT
SUT MNCRL AB 4-0 PS2 18 (SUTURE) ×2 IMPLANT
SUT PROLENE 2 0 KS (SUTURE) IMPLANT
TOWEL OR 17X24 6PK STRL BLUE (TOWEL DISPOSABLE) IMPLANT
TOWEL OR 17X26 10 PK STRL BLUE (TOWEL DISPOSABLE) ×2 IMPLANT
TOWEL OR NON WOVEN STRL DISP B (DISPOSABLE) ×2 IMPLANT
TRAY FOLEY CATH 14FR (SET/KITS/TRAYS/PACK) IMPLANT
TRAY LAPAROSCOPIC (CUSTOM PROCEDURE TRAY) ×2 IMPLANT
TROCAR XCEL BLUNT TIP 100MML (ENDOMECHANICALS) IMPLANT
TROCAR XCEL NON-BLD 11X100MML (ENDOMECHANICALS) IMPLANT
TROCAR XCEL NON-BLD 5MMX100MML (ENDOMECHANICALS) ×2 IMPLANT

## 2012-10-18 NOTE — Anesthesia Postprocedure Evaluation (Signed)
  Anesthesia Post-op Note  Patient: [name]  Procedure(s) Performed: Procedure(s): LAPAROSCOPIC UMBILICAL HERNIA (N/A) INSERTION OF MESH (N/A)  Patient Location: PACU  Anesthesia Type:General  Level of Consciousness: awake  Airway and Oxygen Therapy: Patient Spontanous Breathing  Post-op Pain: mild  Post-op Assessment: Post-op Vital signs reviewed  Post-op Vital Signs: Reviewed  Complications: No apparent anesthesia complications

## 2012-10-18 NOTE — Anesthesia Preprocedure Evaluation (Addendum)
Anesthesia Evaluation  Patient identified by MRN, date of birth, ID band Patient awake    Reviewed: Allergy & Precautions, H&P , NPO status , Patient's Chart, lab work & pertinent test results  Airway Mallampati: II      Dental   Pulmonary neg pulmonary ROS,  breath sounds clear to auscultation        Cardiovascular hypertension, + CAD and + Past MI Rhythm:Regular Rate:Normal     Neuro/Psych    GI/Hepatic Neg liver ROS, GERD-  ,  Endo/Other  negative endocrine ROS  Renal/GU negative Renal ROS     Musculoskeletal   Abdominal   Peds  Hematology   Anesthesia Other Findings   Reproductive/Obstetrics                          Anesthesia Physical Anesthesia Plan  ASA: III  Anesthesia Plan: General   Post-op Pain Management:    Induction:   Airway Management Planned: Oral ETT  Additional Equipment:   Intra-op Plan:   Post-operative Plan: Extubation in OR  Informed Consent: I have reviewed the patients History and Physical, chart, labs and discussed the procedure including the risks, benefits and alternatives for the proposed anesthesia with the patient or authorized representative who has indicated his/her understanding and acceptance.   Dental advisory given  Plan Discussed with: Anesthesiologist, CRNA and Surgeon  Anesthesia Plan Comments:        Anesthesia Quick Evaluation

## 2012-10-18 NOTE — Progress Notes (Signed)
Call to Dr. Randa Evens , anesth., reported latest BP & Pulse & that pt. Didn't take betablocker this a.m. OK to send pt. To [facility] now.

## 2012-10-18 NOTE — Op Note (Signed)
Pre Operative Diagnosis:  Incarcerated umb hernia  Post Operative Diagnosis: same  Procedure: Lap UHR with mesh  Surgeon: Dr. Axel Filler  Assistant: none  Anesthesia: GETA  EBL: 5cc  Complications: none  Counts: reported as correct x 2  Findings:  approx 2cm UH defect  Indications for procedure:  Pt is a 67 y/o M with a h/o UH for several months. PT decided to have this elective repaired.   Details of the procedure: After the patient was consented patient was taken back to the operating room patient was then placed in supine position bilateral SCDs in place. After antibiotics were confirmed a timeout was called and all facts were verified. The Veress needle technique was used to insuflate the abdomen at Palmer's point. The abdomen was insufflated to 14 mm mercury. Subsequently a 5 mm trocar was placed a camera inserted there was no injury to any intra-abdominal organs. It was seen that there was incarcerated omentum within the umbilical hernia. A second camera port was in placed into the left lower quadrant. The omentum was reducedn without injury to it or the small bowel. At this the Falicform ligament was taken down with Bovie cautery maintaining hemostasis. And placed in the epigastrium 5mm port. Once the hernia was cleared away a 12cm PCO parietex mesh was inserted into the abdomen which overlayed the hernia by more than 4cm circumfrentially. The hernia was primarily reapproximated with 0-Vicryl via Endoclose device x2. The mesh was secured circumferentially with a Securestrap tacker. The omentum was brought over the area of the mesh. The falciform ligament was reattached to the anterior abdominal wall with the SecureStrap deviceThe pneumoperitoneum was evacuated & all port sites were removed. The skin was reapproximated opportunities for Monocryl subcuticular fashion. The skin was dressed with Steri-Strips tape and gauze. A pressure dressing was than placed over the umbilical area  The patient was taken to the recovery room in stable condition.

## 2012-10-18 NOTE — Transfer of Care (Signed)
Immediate Anesthesia Transfer of Care Note  Patient: [name]  Procedure(s) Performed: Procedure(s): LAPAROSCOPIC UMBILICAL HERNIA (N/A) INSERTION OF MESH (N/A)  Patient Location: PACU  Anesthesia Type:General  Level of Consciousness: sedated  Airway & Oxygen Therapy: Patient Spontanous Breathing and Patient connected to face mask oxygen  Post-op Assessment: Report given to PACU RN and Post -op Vital signs reviewed and stable  Post vital signs: Reviewed and stable  Complications: No apparent anesthesia complications

## 2012-10-18 NOTE — Preoperative (Signed)
Beta Blockers   Reason not to administer Beta Blockers:Not Applicable 

## 2012-10-18 NOTE — Progress Notes (Signed)
Pt. Remarks that the Zocor had been causing achiness- generalized muscles & joints. Pt. Admits that he hasn't taken the med. In several weeks, maybe almost 2 months. Pt. Encouraged to use My- Chart to communicate the same with MD office.

## 2012-10-18 NOTE — H&P (Signed)
HPI  [name] is a 67 y.o. male. This is a 67 year old man was referred by Dr. Tiburcio Pea for an umbilical hernia evaluation. The patient states that his brother for possibly a year. He states it is getting bigger and is becoming more tender over this period of time. The patient has no signs or symptoms of intestinal obstruction. Patient has had previous stents in the past and see Dr. Sherlie Ban cardiologist.  HPI  Past Medical History   Diagnosis  Date   .  Coronary artery disease      multiple PCIs. has had pinched diagonal with 80-90% ostial stenosis, ETT myoview (3/11): 8/03, no ST changes, hypertensive BP response, no evidence for ischemia or infarction, EF 66%   .  HTN (hypertension)    .  Hyperlipidemia      low HDL   .  Tremor    .  GERD (gastroesophageal reflux disease)    .  Arthritis    .  Myocardial infarction    .  Neuromuscular disorder     Past Surgical History   Procedure  Date   .  Hernia repair    .  Carotid stent  2003   .  Coronary angioplasty with stent placement  2003    Family History   Problem  Relation  Age of Onset   .  Coronary artery disease        family history    .  Stroke  Mother    .  Kidney disease  Mother    .  Diabetes  Mother    .  Cancer  Paternal Uncle       larynx   Social History  History   Substance Use Topics   .  Smoking status:  Former Smoker     Quit date:  12/24/1975   .  Smokeless tobacco:  Never Used   .  Alcohol Use:  Yes      Comment: very seldom    Allergies   Allergen  Reactions   .  Shellfish Allergy  Nausea And Vomiting   .  Clopidogrel Bisulfate      REACTION: rash    Current Outpatient Prescriptions   Medication  Sig  Dispense  Refill   .  aspirin 81 MG tablet  Take 160 mg by mouth daily.     Marland Kitchen  aspirin 81 MG tablet  Take 81 mg by mouth daily.     .  isosorbide mononitrate (IMDUR) 60 MG 24 hr tablet  Take 1 tablet (60 mg total) by mouth daily.  30 tablet  9   .  lisinopril (PRINIVIL,ZESTRIL) 5 MG tablet   Take 1 tablet (5 mg total) by mouth daily.  90 tablet  3   .  nitroGLYCERIN (NITROSTAT) 0.4 MG SL tablet  Place 0.4 mg under the tongue every 5 (five) minutes as needed.     Marland Kitchen  omeprazole (PRILOSEC) 20 MG capsule  Take 20 mg by mouth daily.     .  Omeprazole Magnesium 20.6 (20 BASE) MG CPDR  Take 20.6 mg by mouth daily.     .  Probiotic Product (PROBIOTIC DAILY) CAPS  Take by mouth.     .  propranolol ER (INDERAL LA) 120 MG 24 hr capsule  TAKE ONE CAPSULE BY MOUTH DAILY  30 capsule  5   .  simvastatin (ZOCOR) 40 MG tablet  TAKE 1 TABLET BY MOUTH AT BEDTIME  90 tablet  1  Review of Systems  Review of Systems  Constitutional: Negative.  HENT: Negative.  Respiratory: Negative.  Cardiovascular: Negative.  Gastrointestinal: Positive for abdominal pain (at umb).  Neurological: Negative.  Blood pressure 118/70, pulse 60, resp. rate 16, height 6' (1.829 m), weight 205 lb 3.2 oz (93.078 kg).  Physical Exam  Physical Exam  Constitutional: He is oriented to person, place, and time. He appears well-developed and well-nourished.  HENT:  Head: Normocephalic and atraumatic.  Eyes: Conjunctivae normal and EOM are normal. Pupils are equal, round, and reactive to light.  Neck: Normal range of motion. Neck supple.  Cardiovascular: Normal rate and normal heart sounds.  Pulmonary/Chest: Effort normal and breath sounds normal.  Abdominal: Soft. Bowel sounds are normal.    Musculoskeletal: Normal range of motion.  Neurological: He is alert and oriented to person, place, and time.  Data Reviewed  none  Assessment  67 year old male with an umbilical hernia, with incarcerated omentum.  Plan  1. We will proceed for laparoscopic umbilical hernia repair with mesh.  3. All risks and benefits were discussed with the patient, to generally include infection, bleeding, damage to surrounding structures, and recurrence. Alternatives were offered and described. All questions were answered and the patient voiced  understanding of the procedure and wishes to proceed at this point.

## 2012-10-19 ENCOUNTER — Encounter (HOSPITAL_COMMUNITY): Payer: Self-pay | Admitting: General Surgery

## 2012-10-29 ENCOUNTER — Encounter (INDEPENDENT_AMBULATORY_CARE_PROVIDER_SITE_OTHER): Payer: Medicare Other | Admitting: General Surgery

## 2012-11-18 ENCOUNTER — Encounter (INDEPENDENT_AMBULATORY_CARE_PROVIDER_SITE_OTHER): Payer: Self-pay | Admitting: General Surgery

## 2012-11-18 ENCOUNTER — Ambulatory Visit (INDEPENDENT_AMBULATORY_CARE_PROVIDER_SITE_OTHER): Payer: Medicare PPO | Admitting: General Surgery

## 2012-11-18 VITALS — BP 114/64 | HR 60 | Temp 97.7°F | Resp 16 | Ht 71.0 in | Wt 207.0 lb

## 2012-11-18 DIAGNOSIS — Z09 Encounter for follow-up examination after completed treatment for conditions other than malignant neoplasm: Secondary | ICD-10-CM

## 2012-11-18 NOTE — Progress Notes (Signed)
Patient ID: [name], male   DOB: 18-Jul-1946, 67 y.o.   MRN: [id number] The patient is a 67 year old male status post laparoscopic umbilical hernia repair with mesh. Patient has been doing well postoperatively. He does state he feels a pulling sensation and edge of this mesh is getting better with time.   On exam: His wounds are clean dry and intact There is no palpable hernia.  Assessment and plan: A 67 year old status post lap umbilical hernia repair. Patient to follow up when necessary We discussed 2 more weeks of light lifting, and slowly get back to his normal routine.

## 2012-12-08 ENCOUNTER — Other Ambulatory Visit: Payer: Self-pay | Admitting: Cardiology

## 2013-01-08 ENCOUNTER — Other Ambulatory Visit: Payer: Self-pay | Admitting: Cardiology

## 2013-02-09 ENCOUNTER — Other Ambulatory Visit: Payer: Self-pay | Admitting: Cardiology

## 2013-02-12 ENCOUNTER — Other Ambulatory Visit: Payer: Self-pay | Admitting: Cardiology

## 2013-05-02 ENCOUNTER — Other Ambulatory Visit: Payer: Self-pay | Admitting: Cardiology

## 2013-10-02 ENCOUNTER — Other Ambulatory Visit: Payer: Self-pay | Admitting: Cardiology

## 2013-11-30 ENCOUNTER — Encounter: Payer: Self-pay | Admitting: Cardiology

## 2013-11-30 ENCOUNTER — Ambulatory Visit (INDEPENDENT_AMBULATORY_CARE_PROVIDER_SITE_OTHER): Payer: Medicare PPO | Admitting: Cardiology

## 2013-11-30 VITALS — BP 98/62 | HR 59 | Ht 72.0 in | Wt 199.0 lb

## 2013-11-30 DIAGNOSIS — E782 Mixed hyperlipidemia: Secondary | ICD-10-CM

## 2013-11-30 DIAGNOSIS — E785 Hyperlipidemia, unspecified: Secondary | ICD-10-CM

## 2013-11-30 DIAGNOSIS — R5383 Other fatigue: Secondary | ICD-10-CM

## 2013-11-30 DIAGNOSIS — I2510 Atherosclerotic heart disease of native coronary artery without angina pectoris: Secondary | ICD-10-CM

## 2013-11-30 DIAGNOSIS — I10 Essential (primary) hypertension: Secondary | ICD-10-CM

## 2013-11-30 DIAGNOSIS — R5381 Other malaise: Secondary | ICD-10-CM

## 2013-11-30 LAB — CBC WITH DIFFERENTIAL/PLATELET
BASOS PCT: 0.7 % (ref 0.0–3.0)
Basophils Absolute: 0 10*3/uL (ref 0.0–0.1)
EOS PCT: 3.5 % (ref 0.0–5.0)
Eosinophils Absolute: 0.2 10*3/uL (ref 0.0–0.7)
HEMATOCRIT: 42.2 % (ref 39.0–52.0)
Hemoglobin: 14.4 g/dL (ref 13.0–17.0)
LYMPHS ABS: 1.9 10*3/uL (ref 0.7–4.0)
Lymphocytes Relative: 30.7 % (ref 12.0–46.0)
MCHC: 34.2 g/dL (ref 30.0–36.0)
MCV: 93.2 fl (ref 78.0–100.0)
MONO ABS: 0.5 10*3/uL (ref 0.1–1.0)
MONOS PCT: 7.5 % (ref 3.0–12.0)
Neutro Abs: 3.5 10*3/uL (ref 1.4–7.7)
Neutrophils Relative %: 57.6 % (ref 43.0–77.0)
Platelets: 192 10*3/uL (ref 150.0–400.0)
RBC: 4.53 Mil/uL (ref 4.22–5.81)
RDW: 13.1 % (ref 11.5–14.6)
WBC: 6 10*3/uL (ref 4.5–10.5)

## 2013-11-30 LAB — LIPID PANEL
CHOL/HDL RATIO: 5
Cholesterol: 141 mg/dL (ref 0–200)
HDL: 30.2 mg/dL — ABNORMAL LOW (ref >39.00)
LDL CALC: 96 mg/dL (ref 0–99)
TRIGLYCERIDES: 74 mg/dL (ref 0.0–149.0)
VLDL: 14.8 mg/dL (ref 0.0–40.0)

## 2013-11-30 LAB — BASIC METABOLIC PANEL
BUN: 14 mg/dL (ref 6–23)
CHLORIDE: 105 meq/L (ref 96–112)
CO2: 30 meq/L (ref 19–32)
Calcium: 9.1 mg/dL (ref 8.4–10.5)
Creatinine, Ser: 0.9 mg/dL (ref 0.4–1.5)
GFR: 90.68 mL/min (ref >60.00)
Glucose, Bld: 104 mg/dL — ABNORMAL HIGH (ref 70–99)
POTASSIUM: 3.9 meq/L (ref 3.5–5.1)
SODIUM: 142 meq/L (ref 135–145)

## 2013-11-30 LAB — TSH: TSH: 0.97 u[IU]/mL (ref 0.35–5.50)

## 2013-11-30 MED ORDER — ROSUVASTATIN CALCIUM 5 MG PO TABS: 5.0000 mg | ORAL_TABLET | Freq: Every day | ORAL | Status: DC

## 2013-11-30 MED ORDER — LISINOPRIL 2.5 MG PO TABS: 2.5000 mg | ORAL_TABLET | Freq: Every day | ORAL | Status: DC

## 2013-11-30 NOTE — Progress Notes (Signed)
Patient ID: [name], male   DOB: 03-May-1946, 68 y.o.   MRN: [id number] PCP: Dr. Kenton Kingfisher  68 yo with history of CAD s/p PCI as well as HTN presents for cardiology followup. He had a myoview in 3/11 with no evidence for ischemia or infarction.  He does not have any significant chest pain. He does get some mild shortness of breath walking up a hill. BP is under control.  He is not getting much exercise.  He feels like he has more fatigue than in the past.  Last echo in 2/14 showed EF 55-60% with mild MR.  He has been having problems with muscle soreness.   ECG: NSR, normal  Labs (3/11): K 4.3, creatinine 0.9  Labs (8/11): LDL 80, HDL 31, LFTs normal  Labs (3/12): LDL 85, HDL 27, K 4.3, creatinine 0.7 Labs (1/14): LDL 70, HDL 30, K 4.4, creatinine 1.1 Labs (2/14): K 4.3, creatinine 1.02  Allergies:  1) ! Plavix (Clopidogrel Bisulfate)   Past Medical History:  1. CAD: Multiple PCIs. He has a pinched diagonal with 80-90% ostial stenosis. ETT-myoview (3/11): 8/03", no ST changes, hypertensive BP response, no evidence for ischemia or infarction, EF 66%. Echo (2/14) with EF 55-60%, mild MR.  2. Tremor  3. Hyperlipidemia with low HDL.  4. GERD.  5. HTN  6. Umbilical hernia repair in 3/14.  7. Shellfish allergy  Family History:  CAD   Social History:  non-smoker, non-drinker, no drug abuse  He is disabled  He is married   ROS: All systems reviewed and negative except as per HPI.  Current Outpatient Prescriptions  Medication Sig Dispense Refill  . acetaminophen (TYLENOL) 500 MG tablet Take 500-1,000 mg by mouth every 6 (six) hours as needed for pain.      Marland Kitchen aspirin EC 81 MG tablet Take 81 mg by mouth daily.      . isosorbide mononitrate (IMDUR) 60 MG 24 hr tablet TAKE 1 TABLET BY MOUTH DAILY  30 tablet  10  . nitroGLYCERIN (NITROSTAT) 0.4 MG SL tablet Place 0.4 mg under the tongue every 5 (five) minutes as needed for chest pain.       Marland Kitchen omeprazole (PRILOSEC) 20 MG capsule Take 20 mg  by mouth daily.      . Coenzyme Q10 200 MG TABS Take by mouth.      . EPINEPHrine (EPIPEN 2-PAK) 0.3 mg/0.3 mL DEVI Inject 0.3 mg into the muscle daily as needed (Anaphylaxis).      Marland Kitchen lisinopril (PRINIVIL,ZESTRIL) 2.5 MG tablet Take 1 tablet (2.5 mg total) by mouth daily.  30 tablet  3  . propranolol ER (INDERAL LA) 120 MG 24 hr capsule TAKE ONE CAPSULE BY MOUTH DAILY  30 capsule  11  . rosuvastatin (CRESTOR) 5 MG tablet Take 1 tablet (5 mg total) by mouth daily.  30 tablet  3   No current facility-administered medications for this visit.    BP 98/62  Pulse 59  Ht 6' (1.829 m)  Wt 90.266 kg (199 lb)  BMI 26.98 kg/m2 General: NAD Neck: No JVD, no thyromegaly or thyroid nodule.  Lungs: Clear to auscultation bilaterally with normal respiratory effort. CV: Nondisplaced PMI.  Heart regular S1/S2, +S4, no murmur.  No peripheral edema.  No carotid bruit.  Normal pedal pulses.  Abdomen: Soft, nontender, no hepatosplenomegaly, no distention.  Neurologic: Alert and oriented x 3.  Psych: Normal affect Extremities: No clubbing or cyanosis.   Assessment/Plan:  CAD Stable with no ischemic symptoms.  He notes some generalized fatigue.  Continue ASA, ACEI, statin, Imdur, beta blocker. Given fatigue and low blood pressure today, I am going to have him cut lisinopril in half to 2.5 mg daily. We will call in 2 weeks to see what BP has been running (he will check daily at home).  HYPERLIPIDEMIA  Myalgias may be related to simvastatin.  I will have him stop simvastatin and after 2 wks, start Crestor 5 mg daily with lipids/LFTs in 2 months.  He will also try coenzyme Q10 200 mg daily.    Fatigue As above, will see if changing statin will help.  Also will cut back lisinopril given low BP today.  Finally, will check CBC and TSH.   Larey Dresser 67/03/2014

## 2013-11-30 NOTE — Patient Instructions (Signed)
Stop Zocor.  After you have been off Zocor for 10 days start Crestor 5mg  daily.   Take coenzyme Q10 200mg  daily.  Decrease lisinopril to 2.5mg  daily. You can take 1/2 of your 5mg  tablet daily and use your current supply.  Your physician has requested that you regularly monitor and record your blood pressure readings at home. Please use the same machine at the same time of day to check your readings and record them. I will call you in about 2 weeks and get the readings. Eliot Ford  Your physician recommends that you return for lab work today---Lipid profile/CBCd/TSH/BMET.  Your physician recommends that you return for a FASTING lipid profile /liver profile in 3 months.   Your physician wants you to follow-up in: 1 year with Dr Aundra Dubin. (April 2016).  You will receive a reminder letter in the mail two months in advance. If you don't receive a letter, please call our office to schedule the follow-up appointment.

## 2013-12-19 NOTE — Progress Notes (Signed)
Dr Aundra Dubin reviewed BP readings brought in by patient today. Good BP per Dr Aundra Dubin, readings forwarded to HIM. Pt notified.

## 2014-02-11 ENCOUNTER — Other Ambulatory Visit: Payer: Self-pay | Admitting: Cardiology

## 2014-02-14 ENCOUNTER — Other Ambulatory Visit: Payer: Self-pay | Admitting: Cardiology

## 2014-03-08 ENCOUNTER — Other Ambulatory Visit (INDEPENDENT_AMBULATORY_CARE_PROVIDER_SITE_OTHER): Payer: Medicare PPO

## 2014-03-08 DIAGNOSIS — E785 Hyperlipidemia, unspecified: Secondary | ICD-10-CM

## 2014-03-08 DIAGNOSIS — I2510 Atherosclerotic heart disease of native coronary artery without angina pectoris: Secondary | ICD-10-CM

## 2014-03-08 DIAGNOSIS — I10 Essential (primary) hypertension: Secondary | ICD-10-CM

## 2014-03-08 LAB — HEPATIC FUNCTION PANEL
ALT: 22 U/L (ref 0–53)
AST: 19 U/L (ref 0–37)
Albumin: 4.1 g/dL (ref 3.5–5.2)
Alkaline Phosphatase: 75 U/L (ref 39–117)
BILIRUBIN DIRECT: 0.2 mg/dL (ref 0.0–0.3)
BILIRUBIN TOTAL: 1.1 mg/dL (ref 0.2–1.2)
TOTAL PROTEIN: 7.3 g/dL (ref 6.0–8.3)

## 2014-03-08 LAB — LIPID PANEL
CHOL/HDL RATIO: 5
Cholesterol: 129 mg/dL (ref 0–200)
HDL: 28.6 mg/dL — ABNORMAL LOW (ref >39.00)
LDL CALC: 81 mg/dL (ref 0–99)
NonHDL: 100.4
TRIGLYCERIDES: 96 mg/dL (ref 0.0–149.0)
VLDL: 19.2 mg/dL (ref 0.0–40.0)

## 2014-04-01 ENCOUNTER — Other Ambulatory Visit: Payer: Self-pay | Admitting: Cardiology

## 2014-05-10 ENCOUNTER — Other Ambulatory Visit: Payer: Self-pay | Admitting: Cardiology

## 2014-05-16 ENCOUNTER — Other Ambulatory Visit: Payer: Self-pay | Admitting: Cardiology

## 2014-09-15 ENCOUNTER — Other Ambulatory Visit: Payer: Self-pay | Admitting: Cardiology

## 2014-12-08 ENCOUNTER — Other Ambulatory Visit: Payer: Self-pay | Admitting: *Deleted

## 2014-12-08 MED ORDER — NITROGLYCERIN 0.4 MG SL SUBL: 0.4000 mg | SUBLINGUAL_TABLET | SUBLINGUAL | Status: DC | PRN

## 2014-12-26 ENCOUNTER — Other Ambulatory Visit: Payer: Self-pay | Admitting: Cardiology

## 2015-01-08 ENCOUNTER — Ambulatory Visit: Payer: Medicare PPO | Admitting: Physician Assistant

## 2015-01-15 ENCOUNTER — Other Ambulatory Visit: Payer: Self-pay | Admitting: Cardiology

## 2015-01-26 ENCOUNTER — Other Ambulatory Visit: Payer: Self-pay | Admitting: Cardiology

## 2015-01-29 ENCOUNTER — Encounter: Payer: Self-pay | Admitting: Physician Assistant

## 2015-01-29 ENCOUNTER — Ambulatory Visit (INDEPENDENT_AMBULATORY_CARE_PROVIDER_SITE_OTHER): Payer: Medicare PPO | Admitting: Physician Assistant

## 2015-01-29 VITALS — BP 100/50 | HR 55 | Ht 72.0 in | Wt 200.4 lb

## 2015-01-29 DIAGNOSIS — I10 Essential (primary) hypertension: Secondary | ICD-10-CM | POA: Diagnosis not present

## 2015-01-29 DIAGNOSIS — I25811 Atherosclerosis of native coronary artery of transplanted heart without angina pectoris: Secondary | ICD-10-CM | POA: Diagnosis not present

## 2015-01-29 DIAGNOSIS — E782 Mixed hyperlipidemia: Secondary | ICD-10-CM | POA: Diagnosis not present

## 2015-01-29 LAB — HEPATIC FUNCTION PANEL
ALK PHOS: 76 U/L (ref 39–117)
ALT: 18 U/L (ref 0–53)
AST: 16 U/L (ref 0–37)
Albumin: 4.1 g/dL (ref 3.5–5.2)
BILIRUBIN DIRECT: 0.2 mg/dL (ref 0.0–0.3)
TOTAL PROTEIN: 6.9 g/dL (ref 6.0–8.3)
Total Bilirubin: 1 mg/dL (ref 0.2–1.2)

## 2015-01-29 LAB — LIPID PANEL
CHOL/HDL RATIO: 4
Cholesterol: 123 mg/dL (ref 0–200)
HDL: 28.1 mg/dL — AB (ref >39.00)
LDL Cholesterol: 76 mg/dL (ref 0–99)
NonHDL: 94.9
TRIGLYCERIDES: 94 mg/dL (ref 0.0–149.0)
VLDL: 18.8 mg/dL (ref 0.0–40.0)

## 2015-01-29 NOTE — Assessment & Plan Note (Signed)
Continue Crestor. Check fasting lipid panel and LFTs today. Patient is having muscle aches but it may be related to his daily activities. They're going to keep closer track of this.

## 2015-01-29 NOTE — Assessment & Plan Note (Signed)
Stable without chest pain. Patient's blood pressure is low and I did discuss decreasing his Imdur to 30 mg once daily. Since he's done so well and is not having significant dizziness he will stand the 60 mg for now.

## 2015-01-29 NOTE — Assessment & Plan Note (Signed)
Blood pressure is on the low side. He is asymptomatic. If he becomes symptomatic could decrease Imdur.

## 2015-01-29 NOTE — Patient Instructions (Signed)
Medication Instructions:   Your physician recommends that you continue on your current medications as directed. Please refer to the Current Medication list given to you today.   Labwork:  LFT AND LIPIDS   Testing/Procedures:   Follow-Up:  FOLLOW UP WITH DR [facility] IN 4 TO 6 MONTHS   Any Other Special Instructions Will Be Listed Below (If Applicable).
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
## 2015-01-31 ENCOUNTER — Telehealth: Payer: Self-pay | Admitting: *Deleted

## 2015-02-01 NOTE — Telephone Encounter (Signed)
New message ° ° ° ° °Returning a nurses call to get lab results °

## 2015-02-01 NOTE — Telephone Encounter (Signed)
Pt is aware of lab results.

## 2015-02-07 ENCOUNTER — Other Ambulatory Visit: Payer: Self-pay | Admitting: Cardiology

## 2015-08-01 ENCOUNTER — Encounter: Payer: Self-pay | Admitting: Cardiology

## 2015-08-01 ENCOUNTER — Ambulatory Visit (INDEPENDENT_AMBULATORY_CARE_PROVIDER_SITE_OTHER): Payer: Medicare PPO | Admitting: Cardiology

## 2015-08-01 VITALS — BP 152/70 | HR 56 | Ht 72.0 in | Wt 204.0 lb

## 2015-08-01 DIAGNOSIS — I2510 Atherosclerotic heart disease of native coronary artery without angina pectoris: Secondary | ICD-10-CM | POA: Diagnosis not present

## 2015-08-01 DIAGNOSIS — E782 Mixed hyperlipidemia: Secondary | ICD-10-CM | POA: Diagnosis not present

## 2015-08-01 NOTE — Patient Instructions (Signed)
Medication Instructions:  No changes   Labwork: None   Testing/Procedures: None   Follow-Up: Your physician wants you to follow-up in: 1 year with Dr Aundra Dubin. (December 2017).  You will receive a reminder letter in the mail two months in advance. If you don't receive a letter, please call our office to schedule the follow-up appointment.        If you need a refill on your cardiac medications before your next appointment, please call your pharmacy.

## 2015-08-02 NOTE — Progress Notes (Signed)
Patient ID: [name], male   DOB: [birth year], 69 y.o.   MRN: [id number] PCP: Dr. Kenton Kingfisher  69 yo with history of CAD s/p PCI as well as HTN presents for cardiology followup. He had a myoview in 3/11 with no evidence for ischemia or infarction.   Last echo in 2/14 showed EF 55-60% with mild MR.  BP high today but usually runs on the low side, typically SBP 100s. No exertional dyspnea.  He gets tightness in his chest when bending over to pick up something.  He says that this has been present for years and attributes it to his hiatal hernia.  No change in this pattern.   ECG: NSR, iRBBB  Labs (3/11): K 4.3, creatinine 0.9  Labs (8/11): LDL 80, HDL 31, LFTs normal  Labs (3/12): LDL 85, HDL 27, K 4.3, creatinine 0.7 Labs (1/14): LDL 70, HDL 30, K 4.4, creatinine 1.1 Labs (2/14): K 4.3, creatinine 1.02 Labs (6/16): LDL 76, HDL 28  Allergies:  1) ! Plavix (Clopidogrel Bisulfate)   Past Medical History:  1. CAD: Multiple PCIs. He has a pinched diagonal with 80-90% ostial stenosis. ETT-myoview (3/11): 8/03", no ST changes, hypertensive BP response, no evidence for ischemia or infarction, EF 66%. Echo (2/14) with EF 55-60%, mild MR.  2. Tremor  3. Hyperlipidemia with low HDL: Myalgias with simvastatin.  4. GERD.  5. HTN  6. Umbilical hernia repair in 3/14.  7. Shellfish allergy  Family History:  CAD   Social History:  non-smoker, non-drinker, no drug abuse  He is disabled  He is married   ROS: All systems reviewed and negative except as per HPI.  Current Outpatient Prescriptions  Medication Sig Dispense Refill  . acetaminophen (TYLENOL) 500 MG tablet Take 500-1,000 mg by mouth every 6 (six) hours as needed for pain.    Marland Kitchen aspirin EC 81 MG tablet Take 81 mg by mouth daily.    Marland Kitchen EPINEPHrine (EPIPEN 2-PAK) 0.3 mg/0.3 mL DEVI Inject 0.3 mg into the muscle daily as needed (Anaphylaxis).    . isosorbide mononitrate (IMDUR) 60 MG 24 hr tablet TAKE 1 TABLET BY MOUTH EVERY DAY 90 tablet 3  .  lisinopril (PRINIVIL,ZESTRIL) 2.5 MG tablet TAKE 1 TABLET BY MOUTH EVERY DAY 90 tablet 1  . nitroGLYCERIN (NITROSTAT) 0.4 MG SL tablet Place 1 tablet (0.4 mg total) under the tongue every 5 (five) minutes as needed for chest pain. 25 tablet 1  . omeprazole (PRILOSEC) 20 MG capsule Take 20 mg by mouth daily.    . propranolol ER (INDERAL LA) 120 MG 24 hr capsule TAKE ONE CAPSULE BY MOUTH EVERY DAY 90 capsule 3  . rosuvastatin (CRESTOR) 5 MG tablet Take 1 tablet (5 mg total) by mouth daily. 30 tablet 5   No current facility-administered medications for this visit.    BP 152/70 mmHg  Pulse 56  Ht 6' (1.829 m)  Wt 204 lb (92.534 kg)  BMI 27.66 kg/m2 General: NAD Neck: No JVD, no thyromegaly or thyroid nodule.  Lungs: Clear to auscultation bilaterally with normal respiratory effort. CV: Nondisplaced PMI.  Heart regular S1/S2, no S3/S4, no murmur.  No peripheral edema.  No carotid bruit.  Normal pedal pulses.  Abdomen: Soft, nontender, no hepatosplenomegaly, no distention.  Neurologic: Alert and oriented x 3.  Psych: Normal affect Extremities: No clubbing or cyanosis.   Assessment/Plan:  CAD Stable.  Has mild occasional atypical chest pain, no change to this pattern.  Continue ASA, ACEI, statin, Imdur, beta blocker.  HYPERLIPIDEMIA  Myalgias with simvastatin, doing fine on Crestor.  Good lipids in 6/16.    Loralie Champagne 08/02/2015

## 2015-10-08 ENCOUNTER — Other Ambulatory Visit: Payer: Self-pay | Admitting: Cardiology

## 2015-10-16 ENCOUNTER — Encounter: Payer: Self-pay | Admitting: Gastroenterology

## 2015-11-05 ENCOUNTER — Other Ambulatory Visit: Payer: Self-pay | Admitting: Cardiology

## 2016-04-28 ENCOUNTER — Other Ambulatory Visit: Payer: Self-pay | Admitting: Cardiology

## 2016-04-29 ENCOUNTER — Other Ambulatory Visit: Payer: Self-pay | Admitting: Cardiology

## 2016-05-29 ENCOUNTER — Encounter: Payer: Self-pay | Admitting: Cardiology

## 2016-08-16 ENCOUNTER — Other Ambulatory Visit: Payer: Self-pay | Admitting: Cardiology

## 2016-09-15 ENCOUNTER — Telehealth: Payer: Self-pay | Admitting: *Deleted

## 2016-09-15 NOTE — Telephone Encounter (Signed)
Left generic message on home VM that pt does not need to fast prior to coming in to upcoming appt with Dr Marlou Porch.  Pt recently had blood work at his PCP (in 10/17) which is scanned into system.

## 2016-10-03 ENCOUNTER — Ambulatory Visit (INDEPENDENT_AMBULATORY_CARE_PROVIDER_SITE_OTHER): Payer: Medicare Other | Admitting: Cardiology

## 2016-10-03 ENCOUNTER — Encounter (INDEPENDENT_AMBULATORY_CARE_PROVIDER_SITE_OTHER): Payer: Self-pay

## 2016-10-03 ENCOUNTER — Encounter: Payer: Self-pay | Admitting: Cardiology

## 2016-10-03 VITALS — BP 108/60 | HR 48 | Ht 71.0 in | Wt 185.8 lb

## 2016-10-03 DIAGNOSIS — I2510 Atherosclerotic heart disease of native coronary artery without angina pectoris: Secondary | ICD-10-CM

## 2016-10-03 DIAGNOSIS — E782 Mixed hyperlipidemia: Secondary | ICD-10-CM

## 2016-10-03 DIAGNOSIS — I10 Essential (primary) hypertension: Secondary | ICD-10-CM | POA: Diagnosis not present

## 2016-10-03 DIAGNOSIS — Z91013 Allergy to seafood: Secondary | ICD-10-CM

## 2016-10-03 DIAGNOSIS — I209 Angina pectoris, unspecified: Secondary | ICD-10-CM

## 2016-10-03 MED ORDER — PROPRANOLOL HCL ER 60 MG PO CP24: 60.0000 mg | ORAL_CAPSULE | Freq: Every day | ORAL | 3 refills | Status: DC

## 2016-10-03 NOTE — Patient Instructions (Signed)
Medication Instructions:  Please decrease your Inderal to 60 mg a day. Continue all other medications as listed.  Follow-Up: Follow up in 1 year with Dr. Marlou Porch.  You will receive a letter in the mail 2 months before you are due.  Please call us when you receive this letter to schedule your follow up appointment.  If you need a refill on your cardiac medications before your next appointment, please call your pharmacy.  Thank you for choosing [facility]!!

## 2016-10-03 NOTE — Progress Notes (Signed)
Cardiology Office Note    Date:  10/03/2016   ID:  Tyler, [name] 1946/01/20, MRN [id number]  PCP:  Tyler Frees, MD  Cardiologist:   Tyler Furbish, MD     History of Present Illness:  [name] is a 71 y.o. male former patient of Dr. Olevia [name], [name]  history of CAD s/p PCI as well as HTN presents for cardiology followup. He had a myoview in 3/11 with no evidence for ischemia or infarction.   Last echo in 2/14 showed EF 55-60% with mild MR.  BP high today but usually runs on the low side, typically SBP 100s. No exertional dyspnea.  He gets tightness in his chest when bending over to pick up something.  He says that this has been present for years and attributes it to his hiatal hernia.  No change in this pattern.  5/6 "blockages were fixed "  Lost 12 pounds - prediabetic.   No bleeding, nose to be, no orthopnea, no PND   Past Medical History:  Diagnosis Date  . Arthritis   . Bronchitis   . Coronary artery disease    multiple PCIs. has had pinched diagonal with 80-90% ostial stenosis,  ETT myoview (3/11): 8/03, no ST changes, hypertensive BP response, no evidence for ischemia or infarction, EF 66%     . GERD (gastroesophageal reflux disease)   . HTN (hypertension)    sees Dr. Samara [name]  . Hyperlipidemia    low HDL  . Myocardial infarction    sees Dr. Marigene [name]  . 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 Tremor     Past Surgical History:  Procedure Laterality Date  . CAROTID STENT  2003  . CORONARY ANGIOPLASTY WITH STENT PLACEMENT  2003  . HERNIA REPAIR  [id number]   umbilical hernia repair  . INSERTION OF MESH N/A 10/18/2012   Procedure: INSERTION OF MESH;  Surgeon: Tyler Ok, MD;  Location: [facility];  Service: General;  Laterality: N/A;  . UMBILICAL HERNIA REPAIR N/A 10/18/2012   Procedure: LAPAROSCOPIC UMBILICAL HERNIA;  Surgeon: Tyler Ok, MD;  Location: [facility];  Service: General;  Laterality: N/A;    Current  Medications: Outpatient Medications Prior to Visit  Medication Sig Dispense Refill  . acetaminophen (TYLENOL) 500 MG tablet Take 500-1,000 mg by mouth every 6 (six) hours as needed for pain.    Marland Kitchen aspirin EC 81 MG tablet Take 81 mg by mouth daily.    . CRESTOR 5 MG tablet TAKE 1 TABLET(5 MG) BY MOUTH DAILY 30 tablet 11  . EPINEPHrine (EPIPEN 2-PAK) 0.3 mg/0.3 mL DEVI Inject 0.3 mg into the muscle daily as needed (Anaphylaxis).    . isosorbide mononitrate (IMDUR) 60 MG 24 hr tablet TAKE 1 TABLET BY MOUTH EVERY DAY 90 tablet 1  . lisinopril (PRINIVIL,ZESTRIL) 2.5 MG tablet TAKE 1 TABLET BY MOUTH DAILY 30 tablet 0  . nitroGLYCERIN (NITROSTAT) 0.4 MG SL tablet Place 1 tablet (0.4 mg total) under the tongue every 5 (five) minutes as needed for chest pain. 25 tablet 1  . omeprazole (PRILOSEC) 20 MG capsule Take 20 mg by mouth daily.    . propranolol ER (INDERAL LA) 120 MG 24 hr capsule TAKE 1 CAPSULE BY MOUTH EVERY DAY 90 capsule 1   No facility-administered medications prior to visit.      Allergies:   Shellfish allergy; Clopidogrel bisulfate; and Red wine complex [germanium]   Social History   Social History  .  Marital status: Married    Spouse name: N/A  . Number of children: N/A  . Years of education: N/A   Social History Main Topics  . Smoking status: Former Smoker    Quit date: 12/24/1975  . Smokeless tobacco: Never Used  . Alcohol use Yes     Comment: very seldom  . Drug use: No  . Sexual activity: Not Asked   Other Topics Concern  . None   Social History Narrative   Current disabled     Family History:  The patient's family history includes Cancer in his paternal uncle; Diabetes in his mother; Kidney disease in his mother; Stroke in his mother.   ROS:   Please see the history of present illness.    ROS All other systems reviewed and are negative.   PHYSICAL EXAM:   VS:  BP 108/60   Pulse (!) 48   Ht 5\' 11"  (1.803 m)   Wt 185 lb 12.8 oz (84.3 kg)   BMI 25.91 kg/m     GEN: Well nourished, well developed, in no acute distress  HEENT: normal  Neck: no JVD, carotid bruits, or masses Cardiac: RRR; no murmurs, rubs, or gallops,no edema  Respiratory:  clear to auscultation bilaterally, normal work of breathing GI: soft, nontender, nondistended, + BS MS: no deformity or atrophy  Skin: warm and dry, no rash Neuro:  Alert and Oriented x 3, Strength and sensation are intact Psych: euthymic mood, full affect  Wt Readings from Last 3 Encounters:  10/03/16 185 lb 12.8 oz (84.3 kg)  08/01/15 204 lb (92.5 kg)  01/29/15 200 lb 6.4 oz (90.9 kg)      Studies/Labs Reviewed:   EKG:  EKG is ordered today.  The ekg ordered today demonstrates Sinus bradycardia rate 46 with no other abnormalities. Personally viewed. Since/tracing heart rate is slower.  Recent Labs: No results found for requested labs within last 8760 hours.   Lipid Panel    Component Value Date/Time   CHOL 123 01/29/2015 0849   TRIG 94.0 01/29/2015 0849   HDL 28.10 (L) 01/29/2015 0849   CHOLHDL 4 01/29/2015 0849   VLDL 18.8 01/29/2015 0849   LDLCALC 76 01/29/2015 0849    Additional studies/ records that were reviewed today include:  Prior officeNotes, lab work, EKG reviewed  ECHO: 09/30/12  - Left ventricle: The cavity size was normal. Wall thickness was increased in a pattern of mild LVH. Systolic function was normal. The estimated ejection fraction was in the range of 55% to 60%. Wall motion was normal; there were no regional wall motion abnormalities. Doppler parameters are consistent with abnormal left ventricular relaxation (grade 1 diastolic dysfunction). - Aortic valve: Trivial regurgitation. - Mitral valve: There was mild systolic anterior motion of the chordal structures. Mild regurgitation. - Left atrium: The atrium was mildly dilated.   ASSESSMENT:    1. Atherosclerosis of native coronary artery of native heart without angina pectoris   2. HYPERTENSION,  BENIGN   3. Mixed hyperlipidemia   4. Shellfish allergy   5. Angina pectoris ([place])      PLAN:  In order of problems listed above:  CAD  - Stable.  Has mild occasional atypical chest pain, no change to this pattern.  He thinks sometimes this could be associated with his hiatal hernia which has improved with weight loss. Continue ASA, ACEI, statin, Imdur, beta blocker.   HYPERLIPIDEMIA   - Myalgias with simvastatin, doing fine on Crestor.  Good lipids   ESSENTIAL HYPERTENSION  -  Medications reviewed. No side effects. Continue. Blood pressure has been a bit low. Lisinopril at low-dose 2.5. Renal protective if prediabetic.  - Given his sinus bradycardia and low blood pressure, we will decrease his Inderal to 60 mg.  ANGINA  - Well controlled with isosorbide  PREDIABETES  - good weight loss.   Medication Adjustments/Labs and Tests Ordered: Current medicines are reviewed at length with the patient today.  Concerns regarding medicines are outlined above.  Medication changes, Labs and Tests ordered today are listed in the Patient Instructions below. Patient Instructions  Medication Instructions:  Please decrease your Inderal to 60 mg a day. Continue all other medications as listed.  Follow-Up: Follow up in 1 year with Dr. Marlou Porch.  You will receive a letter in the mail 2 months before you are due.  Please call us when you receive this letter to schedule your follow up appointment.  If you need a refill on your cardiac medications before your next appointment, please call your pharmacy.  Thank you for choosing [facility]!!        Signed, Tyler Furbish, MD  10/03/2016 8:42 AM    [facility] [place], [place], [place]  [address] Phone: [phone]; Fax: [phone]

## 2016-10-09 ENCOUNTER — Other Ambulatory Visit: Payer: Self-pay | Admitting: Cardiology

## 2016-10-22 ENCOUNTER — Other Ambulatory Visit: Payer: Self-pay | Admitting: Cardiology

## 2016-10-22 NOTE — Telephone Encounter (Signed)
Order Providers   Prescribing Provider Encounter Provider  Jerline Pain, MD Jerline Pain, MD  Medication Detail    Disp Refills Start End   propranolol ER (INDERAL LA) 60 MG 24 hr capsule 90 capsule 3 10/03/2016    Sig - Route: Take 1 capsule (60 mg total) by mouth daily. - Oral   E-Prescribing Status: Receipt confirmed by pharmacy (10/03/2016 8:40 AM EST)   Pharmacy   [id number] - [place], [place]

## 2016-11-17 ENCOUNTER — Other Ambulatory Visit: Payer: Self-pay | Admitting: Cardiology

## 2016-11-18 NOTE — Telephone Encounter (Signed)
PCP is fine since they are following lipid panel.  Thank you

## 2016-11-26 ENCOUNTER — Other Ambulatory Visit: Payer: Self-pay | Admitting: Cardiology

## 2016-11-26 MED ORDER — ROSUVASTATIN CALCIUM 5 MG PO TABS: ORAL_TABLET | ORAL | 10 refills | Status: DC

## 2017-01-21 ENCOUNTER — Other Ambulatory Visit: Payer: Self-pay | Admitting: *Deleted

## 2017-01-21 MED ORDER — ROSUVASTATIN CALCIUM 5 MG PO TABS: ORAL_TABLET | ORAL | 2 refills | Status: DC

## 2017-01-21 MED ORDER — LISINOPRIL 2.5 MG PO TABS: 2.5000 mg | ORAL_TABLET | Freq: Every day | ORAL | 2 refills | Status: DC

## 2017-04-05 ENCOUNTER — Encounter (HOSPITAL_COMMUNITY): Payer: Self-pay | Admitting: Emergency Medicine

## 2017-04-05 ENCOUNTER — Emergency Department (HOSPITAL_COMMUNITY): Payer: Medicare Other

## 2017-04-05 ENCOUNTER — Observation Stay (HOSPITAL_COMMUNITY): Payer: Medicare Other

## 2017-04-05 ENCOUNTER — Observation Stay (HOSPITAL_COMMUNITY)
Admission: EM | Admit: 2017-04-05 | Discharge: 2017-04-06 | Disposition: A | Discharge status: Home / Self Care | Payer: Medicare Other | Attending: Internal Medicine | Admitting: Internal Medicine

## 2017-04-05 DIAGNOSIS — Z79899 Other long term (current) drug therapy: Secondary | ICD-10-CM | POA: Diagnosis not present

## 2017-04-05 DIAGNOSIS — R001 Bradycardia, unspecified: Secondary | ICD-10-CM | POA: Insufficient documentation

## 2017-04-05 DIAGNOSIS — E782 Mixed hyperlipidemia: Secondary | ICD-10-CM | POA: Insufficient documentation

## 2017-04-05 DIAGNOSIS — I208 Other forms of angina pectoris: Secondary | ICD-10-CM

## 2017-04-05 DIAGNOSIS — E876 Hypokalemia: Secondary | ICD-10-CM | POA: Insufficient documentation

## 2017-04-05 DIAGNOSIS — R42 Dizziness and giddiness: Secondary | ICD-10-CM | POA: Diagnosis not present

## 2017-04-05 DIAGNOSIS — Z955 Presence of coronary angioplasty implant and graft: Secondary | ICD-10-CM | POA: Insufficient documentation

## 2017-04-05 DIAGNOSIS — I2510 Atherosclerotic heart disease of native coronary artery without angina pectoris: Secondary | ICD-10-CM | POA: Diagnosis not present

## 2017-04-05 DIAGNOSIS — I110 Hypertensive heart disease with heart failure: Secondary | ICD-10-CM | POA: Insufficient documentation

## 2017-04-05 DIAGNOSIS — I5032 Chronic diastolic (congestive) heart failure: Secondary | ICD-10-CM | POA: Insufficient documentation

## 2017-04-05 DIAGNOSIS — I252 Old myocardial infarction: Secondary | ICD-10-CM | POA: Diagnosis not present

## 2017-04-05 DIAGNOSIS — G250 Essential tremor: Secondary | ICD-10-CM | POA: Insufficient documentation

## 2017-04-05 DIAGNOSIS — R079 Chest pain, unspecified: Secondary | ICD-10-CM | POA: Insufficient documentation

## 2017-04-05 DIAGNOSIS — I10 Essential (primary) hypertension: Secondary | ICD-10-CM | POA: Diagnosis not present

## 2017-04-05 DIAGNOSIS — R55 Syncope and collapse: Principal | ICD-10-CM | POA: Insufficient documentation

## 2017-04-05 DIAGNOSIS — Z7982 Long term (current) use of aspirin: Secondary | ICD-10-CM | POA: Insufficient documentation

## 2017-04-05 DIAGNOSIS — Z87891 Personal history of nicotine dependence: Secondary | ICD-10-CM | POA: Insufficient documentation

## 2017-04-05 DIAGNOSIS — K219 Gastro-esophageal reflux disease without esophagitis: Secondary | ICD-10-CM | POA: Diagnosis not present

## 2017-04-05 LAB — BASIC METABOLIC PANEL
ANION GAP: 7 (ref 5–15)
BUN: 17 mg/dL (ref 6–20)
CALCIUM: 8.5 mg/dL — AB (ref 8.9–10.3)
CHLORIDE: 108 mmol/L (ref 101–111)
CO2: 23 mmol/L (ref 22–32)
Creatinine, Ser: 0.94 mg/dL (ref 0.61–1.24)
GFR calc non Af Amer: >60 mL/min (ref >60)
GLUCOSE: 117 mg/dL — AB (ref 65–99)
POTASSIUM: 3.8 mmol/L (ref 3.5–5.1)
Sodium: 138 mmol/L (ref 135–145)

## 2017-04-05 LAB — URINALYSIS, ROUTINE W REFLEX MICROSCOPIC
Glucose, UA: NEGATIVE mg/dL
HGB URINE DIPSTICK: NEGATIVE
Ketones, ur: NEGATIVE mg/dL
Leukocytes, UA: NEGATIVE
Nitrite: NEGATIVE
PH: 5 (ref 5.0–8.0)
Protein, ur: NEGATIVE mg/dL
SPECIFIC GRAVITY, URINE: 1.032 — AB (ref 1.005–1.030)

## 2017-04-05 LAB — CBC
HEMATOCRIT: 39.6 % (ref 39.0–52.0)
HEMOGLOBIN: 13.4 g/dL (ref 13.0–17.0)
MCH: 32.4 pg (ref 26.0–34.0)
MCHC: 33.8 g/dL (ref 30.0–36.0)
MCV: 95.7 fL (ref 78.0–100.0)
Platelets: 150 10*3/uL (ref 150–400)
RBC: 4.14 MIL/uL — AB (ref 4.22–5.81)
RDW: 12.7 % (ref 11.5–15.5)
WBC: 5 10*3/uL (ref 4.0–10.5)

## 2017-04-05 LAB — TROPONIN I

## 2017-04-05 LAB — CBG MONITORING, ED: GLUCOSE-CAPILLARY: 113 mg/dL — AB (ref 65–99)

## 2017-04-05 MED ORDER — MECLIZINE HCL 25 MG PO TABS
25.0000 mg | ORAL_TABLET | Freq: Once | ORAL | Status: AC
  Administered 2017-04-05: 25 mg via ORAL
  Filled 2017-04-05: qty 1

## 2017-04-05 MED ORDER — ISOSORBIDE MONONITRATE ER 60 MG PO TB24
60.0000 mg | ORAL_TABLET | Freq: Every day | ORAL | Status: DC
  Administered 2017-04-06: 60 mg via ORAL
  Filled 2017-04-05: qty 1

## 2017-04-05 MED ORDER — SODIUM CHLORIDE 0.9 % IV SOLN: INTRAVENOUS | Status: DC

## 2017-04-05 MED ORDER — ASPIRIN EC 81 MG PO TBEC
81.0000 mg | DELAYED_RELEASE_TABLET | Freq: Every day | ORAL | Status: DC
  Administered 2017-04-06: 81 mg via ORAL
  Filled 2017-04-05: qty 1

## 2017-04-05 MED ORDER — PROPRANOLOL HCL ER 60 MG PO CP24
60.0000 mg | ORAL_CAPSULE | Freq: Every day | ORAL | Status: DC
  Filled 2017-04-05: qty 1

## 2017-04-05 MED ORDER — SODIUM CHLORIDE 0.9% FLUSH
3.0000 mL | Freq: Two times a day (BID) | INTRAVENOUS | Status: DC
  Administered 2017-04-05 – 2017-04-06 (×2): 3 mL via INTRAVENOUS

## 2017-04-05 MED ORDER — LISINOPRIL 5 MG PO TABS
2.5000 mg | ORAL_TABLET | Freq: Every day | ORAL | Status: DC
  Administered 2017-04-06: 2.5 mg via ORAL
  Filled 2017-04-05: qty 1

## 2017-04-05 MED ORDER — ENOXAPARIN SODIUM 40 MG/0.4ML ~~LOC~~ SOLN
40.0000 mg | SUBCUTANEOUS | Status: DC
  Administered 2017-04-05: 40 mg via SUBCUTANEOUS
  Filled 2017-04-05: qty 0.4

## 2017-04-05 MED ORDER — PANTOPRAZOLE SODIUM 40 MG PO TBEC
40.0000 mg | DELAYED_RELEASE_TABLET | Freq: Every day | ORAL | Status: DC
  Administered 2017-04-06: 40 mg via ORAL
  Filled 2017-04-05: qty 1

## 2017-04-05 MED ORDER — ACETAMINOPHEN 500 MG PO TABS: 500.0000 mg | ORAL_TABLET | Freq: Four times a day (QID) | ORAL | Status: DC | PRN

## 2017-04-05 MED ORDER — IOPAMIDOL (ISOVUE-370) INJECTION 76%
INTRAVENOUS | Status: AC
  Administered 2017-04-05: 50 mL
  Filled 2017-04-05: qty 50

## 2017-04-05 MED ORDER — ROSUVASTATIN CALCIUM 5 MG PO TABS
5.0000 mg | ORAL_TABLET | Freq: Every day | ORAL | Status: DC
  Administered 2017-04-05 – 2017-04-06 (×2): 5 mg via ORAL
  Filled 2017-04-05 (×2): qty 1

## 2017-04-05 NOTE — ED Provider Notes (Signed)
[facility] DEPT Provider Note   CSN: [id number] Arrival date & time: 04/05/17  1204     History   Chief Complaint Chief Complaint  Patient presents with  . Near Syncope    HPI [name] is a 71 y.o. male.  HPI  Pt presenting with c/o sensation of dizziness.  Pt states he had been walking and looked up at something, then when he looked down he felt very dizzy/spinning sensation and was almost falling to the left.  Wife states they were able to get him to a bench.  No chest pain, no lightheadedness, no syncope.  No shortness of breath.  No changes in vision or double vision.  No headache.  Currently he feels back to his usual baseline.  He states he had something similar approx 1 year ago that resolved on its own.  There are no other associated systemic symptoms, there are no other alleviating or modifying factors.   Past Medical History:  Diagnosis Date  . Arthritis   . Bronchitis   . Coronary artery disease    multiple PCIs. has had pinched diagonal with 80-90% ostial stenosis,  ETT myoview (3/11): 8/03, no ST changes, hypertensive BP response, no evidence for ischemia or infarction, EF 66%     . GERD (gastroesophageal reflux disease)   . HTN (hypertension)    sees Dr. Samara Snide  . Hyperlipidemia    low HDL  . Myocardial infarction [facility])    sees Dr. Marigene Ehlers  . 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 Tremor     Patient Active Problem List   Diagnosis Date Noted  . Shellfish allergy 05/12/2012  . MIXED HYPERLIPIDEMIA 01/02/2010  . FATIGUE / MALAISE 11/13/2009  . HYPERLIPIDEMIA-MIXED 11/16/2008  . HYPERTENSION, BENIGN 11/16/2008  . CAD, NATIVE VESSEL 11/16/2008    Past Surgical History:  Procedure Laterality Date  . CAROTID STENT  2003  . CORONARY ANGIOPLASTY WITH STENT PLACEMENT  2003  . HERNIA REPAIR  10/15/23   umbilical hernia repair  . INSERTION OF MESH N/A 10/18/2012   Procedure: INSERTION OF MESH;  Surgeon:  Ralene Ok, MD;  Location: [facility];  Service: General;  Laterality: N/A;  . UMBILICAL HERNIA REPAIR N/A 10/18/2012   Procedure: LAPAROSCOPIC UMBILICAL HERNIA;  Surgeon: Ralene Ok, MD;  Location: [facility];  Service: General;  Laterality: N/A;       Home Medications    Prior to Admission medications   Medication Sig Start Date End Date Taking? Authorizing Provider  acetaminophen (TYLENOL) 500 MG tablet Take 500-1,000 mg by mouth every 6 (six) hours as needed for pain.    [provider]  aspirin EC 81 MG tablet Take 81 mg by mouth daily.    [provider]  EPINEPHrine (EPIPEN 2-PAK) 0.3 mg/0.3 mL DEVI Inject 0.3 mg into the muscle daily as needed (Anaphylaxis).    [provider]  isosorbide mononitrate (IMDUR) 60 MG 24 hr tablet TAKE 1 TABLET BY MOUTH DAILY 10/22/16   Jerline Pain, MD  lisinopril (PRINIVIL,ZESTRIL) 2.5 MG tablet Take 1 tablet (2.5 mg total) by mouth daily. 01/21/17   Evans Lance, MD  nitroGLYCERIN (NITROSTAT) 0.4 MG SL tablet Place 1 tablet (0.4 mg total) under the tongue every 5 (five) minutes as needed for chest pain. 12/08/14   Larey Dresser, MD  omeprazole (PRILOSEC) 20 MG capsule Take 20 mg by mouth daily.    [provider]  propranolol ER (INDERAL  LA) 60 MG 24 hr capsule Take 1 capsule (60 mg total) by mouth daily. 10/03/16   Jerline Pain, MD  rosuvastatin (CRESTOR) 5 MG tablet TAKE 1 TABLET(5 MG) BY MOUTH DAILY 01/21/17   Evans Lance, MD    Family History Family History  Problem Relation Age of Onset  . Stroke Mother   . Kidney disease Mother   . Diabetes Mother   . Cancer Paternal Uncle        larynx  . Coronary artery disease Unknown        family history    Social History Social History  Substance Use Topics  . Smoking status: Former Smoker    Quit date: 12/24/1975  . Smokeless tobacco: Never Used  . Alcohol use Yes     Comment: very seldom     Allergies   Shellfish allergy; Clopidogrel  bisulfate; and Red wine complex [germanium]   Review of Systems Review of Systems  ROS reviewed and all otherwise negative except for mentioned in HPI   Physical Exam Updated Vital Signs BP 113/69   Pulse (!) 50   Temp 98.2 F (36.8 C)   Resp 18   Ht 6' (1.829 m)   Wt 74.8 kg (165 lb)   SpO2 99%   BMI 22.38 kg/m  Vitals reviewed Physical Exam Physical Examination: General appearance - alert, well appearing, and in no distress Mental status - alert, oriented to person, place, and time Eyes - pupils equal and reactive, extraocular eye movements intact, no nystagmus Mouth - mucous membranes moist, pharynx normal without lesions Neck - supple, no significant adenopathy Chest - clear to auscultation, no wheezes, rales or rhonchi, symmetric air entry Heart - normal rate, regular rhythm, normal S1, S2, no murmurs, rubs, clicks or gallops Abdomen - soft, nontender, nondistended, no masses or organomegaly Neurological - alert, oriented x 3, cranial nerves tested and intact, strength 5/5 in extrmities x 4, sensation intact Extremities - peripheral pulses normal, no pedal edema, no clubbing or cyanosis Skin - normal coloration and turgor, no rashes  ED Treatments / Results  Labs (all labs ordered are listed, but only abnormal results are displayed) Labs Reviewed  BASIC METABOLIC PANEL - Abnormal; Notable for the following:       Result Value   Glucose, Bld 117 (*)    Calcium 8.5 (*)    All other components within normal limits  CBC - Abnormal; Notable for the following:    RBC 4.14 (*)    All other components within normal limits  URINALYSIS, ROUTINE W REFLEX MICROSCOPIC - Abnormal; Notable for the following:    Color, Urine AMBER (*)    Specific Gravity, Urine 1.032 (*)    Bilirubin Urine SMALL (*)    All other components within normal limits  CBG MONITORING, ED - Abnormal; Notable for the following:    Glucose-Capillary 113 (*)    All other components within normal limits      EKG  EKG Interpretation  Date/Time:  Sunday April 05 2017 12:07:49 EDT Ventricular Rate:  50 PR Interval:    QRS Duration: 110 QT Interval:  447 QTC Calculation: 408 R Axis:   64 Text Interpretation:  Sinus rhythm RSR' in V1 or V2, right VCD or RVH No significant change since last tracing Confirmed by Alfonzo Beers [phone]) on 04/05/2017 12:23:14 PM       Radiology Ct Head Wo Contrast  Result Date: 04/05/2017 CLINICAL DATA:  TIA.  Near syncope and dizziness EXAM:  CT HEAD WITHOUT CONTRAST TECHNIQUE: Contiguous axial images were obtained from the base of the skull through the vertex without intravenous contrast. COMPARISON:  None. FINDINGS: Brain: Mild atrophy. Negative for hydrocephalus. Mild chronic microvascular ischemic change in the white matter. Negative for acute infarct. Negative for hemorrhage or mass. Vascular: Atherosclerotic calcification. Negative for hyperdense vessel Skull: Negative Sinuses/Orbits: Negative Other: None IMPRESSION: Atrophy and mild chronic microvascular ischemia. Atherosclerotic cerebrovascular disease. No acute intracranial abnormality. Electronically Signed   By: Franchot Gallo M.D.   On: 04/05/2017 13:48    Procedures Procedures (including critical care time)  Medications Ordered in ED Medications  meclizine (ANTIVERT) tablet 25 mg (25 mg Oral Given 04/05/17 1400)     Initial Impression / Assessment and Plan / ED Course  I have reviewed the triage vital signs and the nursing notes.  Pertinent labs & imaging results that were available during my care of the patient were reviewed by me and considered in my medical decision making (see chart for details).     Pt presenting with c/o acute onset of vertigo.  Pt had no chest pain, no shortness of breath or changes in vision or other neurological defects.  He currently has normal neuro exam and no vertigo.  Workup thus far is reassuring.  Awaiting MRI brain to ensure no posterior CVA.  Pt signed out  to oncoming provider pending MRI.  Pt is agreeable with plan.    Final Clinical Impressions(s) / ED Diagnoses   Final diagnoses:  Vertigo    New Prescriptions New Prescriptions   No medications on file     Pixie Casino, MD 04/05/17 1538

## 2017-04-05 NOTE — H&P (Signed)
History and Physical    [name] FYB:[id number] DOB: 04/25/1946 DOA: 04/05/2017  PCP: Shirline Frees, MD  Patient coming from: Home  I have personally briefly reviewed patient's old medical records in [place]  Chief Complaint: Near Syncope  HPI: [name] is a 71 y.o. male with medical history significant of CAD with multiple stents in 2003.  HTN.  Patient presents to the ED with c/o dizziness / near syncope.  This occurred today when he was walking and looked up at something.  Had dizzy / spinning sensation and was almost falling to the left.  No CP, no SOB.  Currently back to baseline.  Similar event x1 year ago that resolved on own.  Also has chronic mild CP with exertion that gets better at rest, this has been ongoing for many years.  ED Course: MRI brain shows questionable low flow in R ICA, questioning proximal obstruction.   Review of Systems: As per HPI otherwise 10 point review of systems negative.   Past Medical History:  Diagnosis Date  . Arthritis   . Bronchitis   . Coronary artery disease    multiple PCIs. has had pinched diagonal with 80-90% ostial stenosis,  ETT myoview (3/11): 8/03, no ST changes, hypertensive BP response, no evidence for ischemia or infarction, EF 66%     . GERD (gastroesophageal reflux disease)   . HTN (hypertension)    sees Dr. Samara Snide  . Hyperlipidemia    low HDL  . Myocardial infarction [facility])    sees Dr. Marigene Ehlers  . 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 Tremor     Past Surgical History:  Procedure Laterality Date  . CORONARY ANGIOPLASTY WITH STENT PLACEMENT  2003  . HERNIA REPAIR  01/18/77   umbilical hernia repair  . INSERTION OF MESH N/A 10/18/2012   Procedure: INSERTION OF MESH;  Surgeon: Ralene Ok, MD;  Location: [place];  Service: General;  Laterality: N/A;  . UMBILICAL HERNIA REPAIR N/A 10/18/2012   Procedure: LAPAROSCOPIC UMBILICAL HERNIA;  Surgeon: Ralene Ok, MD;  Location: [place];  Service: General;  Laterality: N/A;     reports that he quit smoking about 41 years ago. He has never used smokeless tobacco. He reports that he drinks alcohol. He reports that he does not use drugs.  Allergies  Allergen Reactions  . Shellfish Allergy Nausea And Vomiting    Diaphoresis, weak, extreme vomiting   . Clopidogrel Bisulfate Hives    REACTION: rash  . Red Wine Complex [Germanium] Other (See Comments)    unspecified    Family History  Problem Relation Age of Onset  . Stroke Mother   . Kidney disease Mother   . Diabetes Mother   . Cancer Paternal Uncle        larynx  . Coronary artery disease Unknown        family history     Prior to Admission medications   Medication Sig Start Date End Date Taking? Authorizing Provider  acetaminophen (TYLENOL) 500 MG tablet Take 500-1,000 mg by mouth every 6 (six) hours as needed for pain.   Yes [provider]  aspirin EC 81 MG tablet Take 81 mg by mouth daily.   Yes [provider]  EPINEPHrine (EPIPEN 2-PAK) 0.3 mg/0.3 mL DEVI Inject 0.3 mg into the muscle daily as needed (Anaphylaxis).   Yes [provider]  isosorbide mononitrate (IMDUR) 60 MG 24 hr tablet TAKE 1  TABLET BY MOUTH DAILY 10/22/16  Yes Jerline Pain, MD  lisinopril (PRINIVIL,ZESTRIL) 2.5 MG tablet Take 1 tablet (2.5 mg total) by mouth daily. 01/21/17  Yes Evans Lance, MD  omeprazole (PRILOSEC) 20 MG capsule Take 20 mg by mouth daily.   Yes [provider]  propranolol ER (INDERAL LA) 60 MG 24 hr capsule Take 1 capsule (60 mg total) by mouth daily. Patient taking differently: Take 120 mg by mouth daily.  10/03/16  Yes Jerline Pain, MD  rosuvastatin (CRESTOR) 5 MG tablet TAKE 1 TABLET(5 MG) BY MOUTH DAILY 01/21/17  Yes Evans Lance, MD  nitroGLYCERIN (NITROSTAT) 0.4 MG SL tablet Place 1 tablet (0.4 mg total) under the tongue every 5 (five) minutes as needed for chest pain. 12/08/14   Larey Dresser, MD    Physical Exam: Vitals:   04/05/17 1800 04/05/17 1815 04/05/17 1830 04/05/17 1845  BP: 128/78 (!) 144/79 123/84 135/71  Pulse: (!) 53 (!) 54 61 (!) 54  Resp: 15 19 16 14   Temp:      SpO2: 99% 96% 100% 100%  Weight:      Height:        Constitutional: NAD, calm, comfortable Eyes: PERRL, lids and conjunctivae normal ENMT: Mucous membranes are moist. Posterior pharynx clear of any exudate or lesions.Normal dentition.  Neck: normal, supple, no masses, no thyromegaly Respiratory: clear to auscultation bilaterally, no wheezing, no crackles. Normal respiratory effort. No accessory muscle use.  Cardiovascular: Regular rate and rhythm, no murmurs / rubs / gallops. No extremity edema. 2+ pedal pulses. No carotid bruits.  Abdomen: no tenderness, no masses palpated. No hepatosplenomegaly. Bowel sounds positive.  Musculoskeletal: no clubbing / cyanosis. No joint deformity upper and lower extremities. Good ROM, no contractures. Normal muscle tone.  Skin: no rashes, lesions, ulcers. No induration Neurologic: CN 2-12 grossly intact. Sensation intact, DTR normal. Strength 5/5 in all 4.  Psychiatric: Normal judgment and insight. Alert and oriented x 3. Normal mood.    Labs on Admission: I have personally reviewed following labs and imaging studies  CBC:  Recent Labs Lab 04/05/17 1209  WBC 5.0  HGB 13.4  HCT 39.6  MCV 95.7  PLT 824   Basic Metabolic Panel:  Recent Labs Lab 04/05/17 1209  NA 138  K 3.8  CL 108  CO2 23  GLUCOSE 117*  BUN 17  CREATININE 0.94  CALCIUM 8.5*   GFR: Estimated Creatinine Clearance: 77.4 mL/min (by C-G formula based on SCr of 0.94 mg/dL). Liver Function Tests: No results for input(s): AST, ALT, ALKPHOS, BILITOT, PROT, ALBUMIN in the last 168 hours. No results for input(s): LIPASE, AMYLASE in the last 168 hours. No results for input(s): AMMONIA in the last 168 hours. Coagulation Profile: No results for input(s): INR, PROTIME in the last 168  hours. Cardiac Enzymes: No results for input(s): CKTOTAL, CKMB, CKMBINDEX, TROPONINI in the last 168 hours. BNP (last 3 results) No results for input(s): PROBNP in the last 8760 hours. HbA1C: No results for input(s): HGBA1C in the last 72 hours. CBG:  Recent Labs Lab 04/05/17 1223  GLUCAP 113*   Lipid Profile: No results for input(s): CHOL, HDL, LDLCALC, TRIG, CHOLHDL, LDLDIRECT in the last 72 hours. Thyroid Function Tests: No results for input(s): TSH, T4TOTAL, FREET4, T3FREE, THYROIDAB in the last 72 hours. Anemia Panel: No results for input(s): VITAMINB12, FOLATE, FERRITIN, TIBC, IRON, RETICCTPCT in the last 72 hours. Urine analysis:    Component Value Date/Time   COLORURINE AMBER (A)  04/05/2017 1401   APPEARANCEUR CLEAR 04/05/2017 1401   LABSPEC 1.032 (H) 04/05/2017 1401   PHURINE 5.0 04/05/2017 1401   GLUCOSEU NEGATIVE 04/05/2017 1401   HGBUR NEGATIVE 04/05/2017 1401   BILIRUBINUR SMALL (A) 04/05/2017 1401   KETONESUR NEGATIVE 04/05/2017 1401   PROTEINUR NEGATIVE 04/05/2017 1401   NITRITE NEGATIVE 04/05/2017 1401   LEUKOCYTESUR NEGATIVE 04/05/2017 1401    Radiological Exams on Admission: Dg Chest 2 View  Result Date: 04/05/2017 CLINICAL DATA:  Syncope EXAM: CHEST  2 VIEW COMPARISON:  10/18/2012 FINDINGS: The heart size and mediastinal contours are within normal limits. Both lungs are clear. The visualized skeletal structures are unremarkable. IMPRESSION: No active cardiopulmonary disease. Electronically Signed   By: Franchot Gallo M.D.   On: 04/05/2017 19:41   Ct Head Wo Contrast  Result Date: 04/05/2017 CLINICAL DATA:  TIA.  Near syncope and dizziness EXAM: CT HEAD WITHOUT CONTRAST TECHNIQUE: Contiguous axial images were obtained from the base of the skull through the vertex without intravenous contrast. COMPARISON:  None. FINDINGS: Brain: Mild atrophy. Negative for hydrocephalus. Mild chronic microvascular ischemic change in the white matter. Negative for acute  infarct. Negative for hemorrhage or mass. Vascular: Atherosclerotic calcification. Negative for hyperdense vessel Skull: Negative Sinuses/Orbits: Negative Other: None IMPRESSION: Atrophy and mild chronic microvascular ischemia. Atherosclerotic cerebrovascular disease. No acute intracranial abnormality. Electronically Signed   By: Franchot Gallo M.D.   On: 04/05/2017 13:48   Mr Brain Wo Contrast  Result Date: 04/05/2017 CLINICAL DATA:  Initial evaluation for acute dizziness. EXAM: MRI HEAD WITHOUT CONTRAST TECHNIQUE: Multiplanar, multiecho pulse sequences of the brain and surrounding structures were obtained without intravenous contrast. COMPARISON:  Prior CT from earlier the same day. FINDINGS: Brain: Generalized age-related cerebral atrophy. Mild chronic microvascular ischemic disease present within the periventricular white matter. Small remote lacunar infarct noted within the. Tear white matter on the right. No evidence for acute or subacute ischemia. Gray-white matter differentiation maintained. No evidence for acute or chronic intracranial hemorrhage. No mass lesion, midline shift or mass effect. No hydrocephalus. No extra-axial fluid collection. Major dural sinuses are grossly patent. Pituitary gland suprasellar region within normal limits. Vascular: Major intracranial vascular flow voids are maintained. Minimal signal abnormality within the cavernous and petrous right ICA suspected be related to slow flow (series 6, image 9), which may reflect a proximal stenosis. Skull and upper cervical spine: Craniocervical junction within normal limits. Visualized upper cervical spine unremarkable. Bone marrow signal intensity within normal limits. No scalp soft tissue abnormality. Sinuses/Orbits: Globes and oval soft tissues within normal limits. Paranasal sinuses are clear. Trace fluid noted within the bilateral mastoid air cells, of doubtful significance. Inner ear structures grossly normal. IMPRESSION: 1. No acute  intracranial infarct or other process identified. 2. Mild loss of flow related signal within the cavernous right ICA, which may be related to slow flow. Findings suggests a possible proximal stenosis. Finding could be further assessed with dedicated carotid Doppler ultrasound and/or vascular imaging of the neck as clinically desired. 3. Age appropriate cerebral atrophy with mild chronic small vessel ischemic disease. Electronically Signed   By: Jeannine Boga M.D.   On: 04/05/2017 17:17    EKG: Independently reviewed.  Assessment/Plan Principal Problem:   Near syncope Active Problems:   Mixed hyperlipidemia   HYPERTENSION, BENIGN   CAD, NATIVE VESSEL    1. Near syncope with bending of neck - 1. Suspicious for VBI 2. CTA head and neck per my brief discussion with neurology would be best if atherosclerotic disease  is suspected to be cause (which I do suspect based on his extensive CAD and stenting in 2003). 3. If syncope work up is otherwise negative, consider reducing propanolol dose from his 60mg  / day he takes at home given resting bradycardia in the 50s.  But this has been asymptomatic even when he got down to 39 here in the ED. 4. Syncope pathway including 2d echo and tele monitor ordered 2. HTN - 1. Continue home meds 3. CAD - continue ASA, follows with cards, likely stable 4. HLD - continue statin  DVT prophylaxis: Lovenox Code Status: Full Family Communication: Wife at bedside Disposition Plan: Home after admit Consults called: None Admission status: Place in obs   Mckinzie Saksa, [place] [facility] Pager [phone]  If 7AM-7PM, please contact day team taking care of patient [email or web address] Password [facility]  04/05/2017, 8:14 PM

## 2017-04-05 NOTE — ED Triage Notes (Signed)
Pt BIB [facility] with near syncope and dizziness while [place]. Pt states he had been walking around 30 min and began to feel lightheaded, sat down and called EMS. Pt denies cp, sob or nausea. Takes Propanolol in mornings, took am dose. BP was 122/84 and HR 48. States similar happened 15 yrs ago, hx of stents placed 10 yrs ago. Alert, VSS

## 2017-04-05 NOTE — ED Provider Notes (Signed)
Signed out pending MRI brain. Evidence of decrease flow in right internal carotid with suspected proximal obstruction. Patient is currently asymptomatic. On further questioning patient states he's been getting chest discomfort radiate into his back which is worse with any exertion but improved with rest for some time now. Currently denies any chest symptoms. Last cath was when stents were placed back in early 2000. Discussed with hospitalist who will admit patient for observation on telemetry bed.   Julianne Rice, MD 04/05/17 [id number]

## 2017-04-06 ENCOUNTER — Other Ambulatory Visit: Payer: Self-pay | Admitting: Emergency Medicine

## 2017-04-06 ENCOUNTER — Other Ambulatory Visit (HOSPITAL_COMMUNITY): Payer: Medicare Other

## 2017-04-06 ENCOUNTER — Observation Stay (HOSPITAL_BASED_OUTPATIENT_CLINIC_OR_DEPARTMENT_OTHER): Payer: Medicare Other

## 2017-04-06 DIAGNOSIS — R55 Syncope and collapse: Secondary | ICD-10-CM

## 2017-04-06 DIAGNOSIS — I340 Nonrheumatic mitral (valve) insufficiency: Secondary | ICD-10-CM

## 2017-04-06 DIAGNOSIS — R001 Bradycardia, unspecified: Secondary | ICD-10-CM | POA: Diagnosis not present

## 2017-04-06 DIAGNOSIS — I10 Essential (primary) hypertension: Secondary | ICD-10-CM

## 2017-04-06 DIAGNOSIS — R42 Dizziness and giddiness: Secondary | ICD-10-CM

## 2017-04-06 DIAGNOSIS — I4510 Unspecified right bundle-branch block: Secondary | ICD-10-CM | POA: Insufficient documentation

## 2017-04-06 DIAGNOSIS — I2510 Atherosclerotic heart disease of native coronary artery without angina pectoris: Secondary | ICD-10-CM | POA: Diagnosis not present

## 2017-04-06 LAB — CBC
HCT: 41.1 % (ref 39.0–52.0)
Hemoglobin: 13.7 g/dL (ref 13.0–17.0)
MCH: 31.6 pg (ref 26.0–34.0)
MCHC: 33.3 g/dL (ref 30.0–36.0)
MCV: 94.7 fL (ref 78.0–100.0)
Platelets: 147 10*3/uL — ABNORMAL LOW (ref 150–400)
RBC: 4.34 MIL/uL (ref 4.22–5.81)
RDW: 12.4 % (ref 11.5–15.5)
WBC: 6.1 10*3/uL (ref 4.0–10.5)

## 2017-04-06 LAB — ECHOCARDIOGRAM COMPLETE
Height: 70 in
Weight: 2542.4 oz

## 2017-04-06 LAB — BASIC METABOLIC PANEL
ANION GAP: 6 (ref 5–15)
BUN: 13 mg/dL (ref 6–20)
CALCIUM: 9 mg/dL (ref 8.9–10.3)
CO2: 29 mmol/L (ref 22–32)
Chloride: 109 mmol/L (ref 101–111)
Creatinine, Ser: 0.95 mg/dL (ref 0.61–1.24)
GLUCOSE: 86 mg/dL (ref 65–99)
Potassium: 3.3 mmol/L — ABNORMAL LOW (ref 3.5–5.1)
SODIUM: 144 mmol/L (ref 135–145)

## 2017-04-06 LAB — GLUCOSE, CAPILLARY: GLUCOSE-CAPILLARY: 88 mg/dL (ref 65–99)

## 2017-04-06 MED ORDER — POTASSIUM CHLORIDE CRYS ER 20 MEQ PO TBCR
40.0000 meq | EXTENDED_RELEASE_TABLET | Freq: Once | ORAL | Status: AC
  Administered 2017-04-06: 40 meq via ORAL
  Filled 2017-04-06: qty 2

## 2017-04-06 MED ORDER — ENSURE ENLIVE PO LIQD
237.0000 mL | Freq: Two times a day (BID) | ORAL | Status: DC
  Administered 2017-04-06: 237 mL via ORAL

## 2017-04-06 NOTE — Discharge Summary (Signed)
Physician Discharge Summary  [name] [id number]:[id number] DOB: Jun 22, 1946 DOA: 04/05/2017  PCP: Shirline Frees, MD  Admit date: 04/05/2017 Discharge date: 04/06/2017   Recommendations for Outpatient Follow-Up:   1. outpatient 30 day event monitor 2. Propranolol stopped   Discharge Diagnosis:   Principal Problem:   Near syncope Active Problems:   Mixed hyperlipidemia   HYPERTENSION, BENIGN   CAD, NATIVE VESSEL   Discharge disposition:  Home.   Discharge Condition: Improved.  Diet recommendation: Low sodium, heart healthy  Wound care: None.   History of Present Illness:   [name] is a 71 y.o. male with medical history significant of CAD with multiple stents in 2003.  HTN.  Patient presents to the ED with c/o dizziness / near syncope.  This occurred today when he was walking and looked up at something.  Had dizzy / spinning sensation and was almost falling to the left.  No CP, no SOB.  Currently back to baseline.  Similar event x1 year ago that resolved on own.   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 I agree with holding the beta blocker indefinitely for now and we will arrange for a 30 day outpatient monitor. He will have follow-up with Dr. Marlou Porch afterward. -propranolol d/c'd -CTA of head and neck without significant blockages   Medical Consultants:    cards   Discharge Exam:   Vitals:   04/05/17 2201 04/06/17 0623  BP: 130/67 125/60  Pulse: 60 (!) 57  Resp:  17  Temp:  97.8 F (36.6 C)  SpO2: 99% 100%   Vitals:   04/05/17 2154 04/05/17 2200 04/05/17 2201 04/06/17 0623  BP: (!) 128/99 132/71 130/67 125/60  Pulse: (!) 50 (!) 53 60 (!) 57  Resp: 17   17  Temp: 98 F (36.7 C)   97.8 F (36.6 C)  TempSrc: Oral   Oral  SpO2: 100% 100% 99% 100%  Weight: 72.1 kg (158 lb 14.4 oz)       Height: 5\' 10"  (1.778 m)       Gen:  NAD   The results of significant diagnostics from this hospitalization (including imaging, microbiology, ancillary and laboratory) are listed below for reference.     Procedures and Diagnostic Studies:   Ct Angio Head W Or Wo Contrast  Result Date: 04/05/2017 CLINICAL DATA:  Atherosclerotic disease.  Near syncope and dizziness EXAM: CT ANGIOGRAPHY HEAD AND NECK TECHNIQUE: Multidetector CT imaging of the head and neck was performed using the standard protocol during bolus administration of intravenous contrast. Multiplanar CT image reconstructions and MIPs were obtained to evaluate the vascular anatomy. Carotid stenosis measurements (when applicable) are obtained utilizing NASCET criteria, using the distal internal carotid diameter as the denominator. CONTRAST:  50 mL Isovue 370 IV COMPARISON:  . MRI and CT head 04/05/2017 FINDINGS: CTA NECK FINDINGS Aortic arch: Negative aortic arch and proximal great vessels Right carotid system: Right carotid system widely patent without significant atherosclerotic disease or stenosis Left carotid system: Left carotid system widely patent without significant atherosclerotic disease or stenosis Vertebral arteries: Both vertebral arteries widely patent to the basilar without stenosis Skeleton: Cervical spine degenerative change. No acute skeletal abnormality. Other neck: Negative Upper chest: Lung apices clear. Review of the MIP images confirms the above findings CTA HEAD FINDINGS Anterior circulation: Mild atherosclerotic calcification in the cavernous carotid bilaterally without significant  stenosis. Anterior middle cerebral arteries are widely patent without significant stenosis. Mild atherosclerotic irregularity left M1 segment Posterior circulation: Both vertebral arteries are patent to the basilar with minimal atherosclerotic disease. PICA patent bilaterally. Basilar widely patent. Superior cerebellar and posterior cerebral  artery is widely patent bilaterally. Venous sinuses: Negative Anatomic variants: None Delayed phase: Normal enhancement on delayed imaging. Review of the MIP images confirms the above findings IMPRESSION: No significant carotid or vertebral artery stenosis in the neck Mild atherosclerotic disease in the cavernous carotid bilaterally without stenosis. Mild atherosclerotic disease in the left M1 segment. No significant intracranial stenosis. Electronically Signed   By: Franchot Gallo M.D.   On: 04/05/2017 21:27   Dg Chest 2 View  Result Date: 04/05/2017 CLINICAL DATA:  Syncope EXAM: CHEST  2 VIEW COMPARISON:  10/18/2012 FINDINGS: The heart size and mediastinal contours are within normal limits. Both lungs are clear. The visualized skeletal structures are unremarkable. IMPRESSION: No active cardiopulmonary disease. Electronically Signed   By: Franchot Gallo M.D.   On: 04/05/2017 19:41   Ct Head Wo Contrast  Result Date: 04/05/2017 CLINICAL DATA:  TIA.  Near syncope and dizziness EXAM: CT HEAD WITHOUT CONTRAST TECHNIQUE: Contiguous axial images were obtained from the base of the skull through the vertex without intravenous contrast. COMPARISON:  None. FINDINGS: Brain: Mild atrophy. Negative for hydrocephalus. Mild chronic microvascular ischemic change in the white matter. Negative for acute infarct. Negative for hemorrhage or mass. Vascular: Atherosclerotic calcification. Negative for hyperdense vessel Skull: Negative Sinuses/Orbits: Negative Other: None IMPRESSION: Atrophy and mild chronic microvascular ischemia. Atherosclerotic cerebrovascular disease. No acute intracranial abnormality. Electronically Signed   By: Franchot Gallo M.D.   On: 04/05/2017 13:48   Ct Angio Neck W Or Wo Contrast  Result Date: 04/05/2017 CLINICAL DATA:  Atherosclerotic disease.  Near syncope and dizziness EXAM: CT ANGIOGRAPHY HEAD AND NECK TECHNIQUE: Multidetector CT imaging of the head and neck was performed using the standard  protocol during bolus administration of intravenous contrast. Multiplanar CT image reconstructions and MIPs were obtained to evaluate the vascular anatomy. Carotid stenosis measurements (when applicable) are obtained utilizing NASCET criteria, using the distal internal carotid diameter as the denominator. CONTRAST:  50 mL Isovue 370 IV COMPARISON:  . MRI and CT head 04/05/2017 FINDINGS: CTA NECK FINDINGS Aortic arch: Negative aortic arch and proximal great vessels Right carotid system: Right carotid system widely patent without significant atherosclerotic disease or stenosis Left carotid system: Left carotid system widely patent without significant atherosclerotic disease or stenosis Vertebral arteries: Both vertebral arteries widely patent to the basilar without stenosis Skeleton: Cervical spine degenerative change. No acute skeletal abnormality. Other neck: Negative Upper chest: Lung apices clear. Review of the MIP images confirms the above findings CTA HEAD FINDINGS Anterior circulation: Mild atherosclerotic calcification in the cavernous carotid bilaterally without significant stenosis. Anterior middle cerebral arteries are widely patent without significant stenosis. Mild atherosclerotic irregularity left M1 segment Posterior circulation: Both vertebral arteries are patent to the basilar with minimal atherosclerotic disease. PICA patent bilaterally. Basilar widely patent. Superior cerebellar and posterior cerebral artery is widely patent bilaterally. Venous sinuses: Negative Anatomic variants: None Delayed phase: Normal enhancement on delayed imaging. Review of the MIP images confirms the above findings IMPRESSION: No significant carotid or vertebral artery stenosis in the neck Mild atherosclerotic disease in the cavernous carotid bilaterally without stenosis. Mild atherosclerotic disease in the left M1 segment. No significant intracranial stenosis. Electronically Signed   By: Franchot Gallo M.D.   On:  04/05/2017 21:27  Mr Brain Wo Contrast  Result Date: 04/05/2017 CLINICAL DATA:  Initial evaluation for acute dizziness. EXAM: MRI HEAD WITHOUT CONTRAST TECHNIQUE: Multiplanar, multiecho pulse sequences of the brain and surrounding structures were obtained without intravenous contrast. COMPARISON:  Prior CT from earlier the same day. FINDINGS: Brain: Generalized age-related cerebral atrophy. Mild chronic microvascular ischemic disease present within the periventricular white matter. Small remote lacunar infarct noted within the. Tear white matter on the right. No evidence for acute or subacute ischemia. Gray-white matter differentiation maintained. No evidence for acute or chronic intracranial hemorrhage. No mass lesion, midline shift or mass effect. No hydrocephalus. No extra-axial fluid collection. Major dural sinuses are grossly patent. Pituitary gland suprasellar region within normal limits. Vascular: Major intracranial vascular flow voids are maintained. Minimal signal abnormality within the cavernous and petrous right ICA suspected be related to slow flow (series 6, image 9), which may reflect a proximal stenosis. Skull and upper cervical spine: Craniocervical junction within normal limits. Visualized upper cervical spine unremarkable. Bone marrow signal intensity within normal limits. No scalp soft tissue abnormality. Sinuses/Orbits: Globes and oval soft tissues within normal limits. Paranasal sinuses are clear. Trace fluid noted within the bilateral mastoid air cells, of doubtful significance. Inner ear structures grossly normal. IMPRESSION: 1. No acute intracranial infarct or other process identified. 2. Mild loss of flow related signal within the cavernous right ICA, which may be related to slow flow. Findings suggests a possible proximal stenosis. Finding could be further assessed with dedicated carotid Doppler ultrasound and/or vascular imaging of the neck as clinically desired. 3. Age appropriate  cerebral atrophy with mild chronic small vessel ischemic disease. Electronically Signed   By: Jeannine Boga M.D.   On: 04/05/2017 17:17     Labs:   Basic Metabolic Panel:  Recent Labs Lab 04/05/17 1209 04/06/17 0259  NA 138 144  K 3.8 3.3*  CL 108 109  CO2 23 29  GLUCOSE 117* 86  BUN 17 13  CREATININE 0.94 0.95  CALCIUM 8.5* 9.0   GFR Estimated Creatinine Clearance: 73.8 mL/min (by C-G formula based on SCr of 0.95 mg/dL). Liver Function Tests: No results for input(s): AST, ALT, ALKPHOS, BILITOT, PROT, ALBUMIN in the last 168 hours. No results for input(s): LIPASE, AMYLASE in the last 168 hours. No results for input(s): AMMONIA in the last 168 hours. Coagulation profile No results for input(s): INR, PROTIME in the last 168 hours.  CBC:  Recent Labs Lab 04/05/17 1209 04/06/17 0259  WBC 5.0 6.1  HGB 13.4 13.7  HCT 39.6 41.1  MCV 95.7 94.7  PLT 150 147*   Cardiac Enzymes:  Recent Labs Lab 04/05/17 2152  TROPONINI <0.03   BNP: Invalid input(s): POCBNP CBG:  Recent Labs Lab 04/05/17 1223 04/06/17 0623  GLUCAP 113* 88   D-Dimer No results for input(s): DDIMER in the last 72 hours. Hgb A1c No results for input(s): HGBA1C in the last 72 hours. Lipid Profile No results for input(s): CHOL, HDL, LDLCALC, TRIG, CHOLHDL, LDLDIRECT in the last 72 hours. Thyroid function studies No results for input(s): TSH, T4TOTAL, T3FREE, THYROIDAB in the last 72 hours.  Invalid input(s): FREET3 Anemia work up No results for input(s): VITAMINB12, FOLATE, FERRITIN, TIBC, IRON, RETICCTPCT in the last 72 hours. Microbiology No results found for this or any previous visit (from the past 240 hour(s)).   Discharge Instructions:   Discharge Instructions    Diet - low sodium heart healthy    Complete by:  As directed    Discharge instructions  Complete by:  As directed    30 day event monitor Stop your propranolol   Increase activity slowly    Complete by:  As  directed      Allergies as of 04/06/2017      Reactions   Shellfish Allergy Nausea And Vomiting   Diaphoresis, weak, extreme vomiting    Clopidogrel Bisulfate Hives   REACTION: rash   Red Wine Complex [germanium] Other (See Comments)   unspecified      Medication List    STOP taking these medications   propranolol ER 60 MG 24 hr capsule Commonly known as:  INDERAL LA     TAKE these medications   acetaminophen 500 MG tablet Commonly known as:  TYLENOL Take 500-1,000 mg by mouth every 6 (six) hours as needed for pain.   aspirin EC 81 MG tablet Take 81 mg by mouth daily.   EPIPEN 2-PAK 0.3 mg/0.3 mL Devi Generic drug:  EPINEPHrine Inject 0.3 mg into the muscle daily as needed (Anaphylaxis).   isosorbide mononitrate 60 MG 24 hr tablet Commonly known as:  IMDUR TAKE 1 TABLET BY MOUTH DAILY   lisinopril 2.5 MG tablet Commonly known as:  PRINIVIL,ZESTRIL Take 1 tablet (2.5 mg total) by mouth daily.   nitroGLYCERIN 0.4 MG SL tablet Commonly known as:  NITROSTAT Place 1 tablet (0.4 mg total) under the tongue every 5 (five) minutes as needed for chest pain.   omeprazole 20 MG capsule Commonly known as:  PRILOSEC Take 20 mg by mouth daily.   rosuvastatin 5 MG tablet Commonly known as:  CRESTOR TAKE 1 TABLET(5 MG) BY MOUTH DAILY      Follow-up Information    Isaiah Serge, NP Follow up on 05/11/2017.   Specialties:  Cardiology, Radiology Why:  Your follow-up appointment time is at 10am. please arrive 15 minutes early.  The office will call you to arrange picking up your 30 day heart monitor. Please call the office if you have not heard from them by Friday. Contact information: [address] [place] [address] [phone]        Shirline Frees, MD Follow up in 1 week(s).   Specialty:  Family Medicine Contact information: [address] [place] [address] [phone]            Time coordinating discharge: 35  min  Signed:  Damara Klunder Alison Stalling   [facility] 04/06/2017, 2:08 PM

## 2017-04-06 NOTE — Progress Notes (Signed)
Patient discharge teaching given, including activity, diet, follow-up appoints, and medications. Patient verbalized understanding of all discharge instructions. IV access was d/c'd. Vitals are stable. Skin is intact except as charted in most recent assessments. Pt to be escorted out by NT, to be driven home by family. 

## 2017-04-06 NOTE — Progress Notes (Signed)
Pt arrived 5w20 at around 10pm with wife. Pt assessed to AxOx4. No complaints of pain. Pt was oriented to room. Cardiac telemetry and continuous pulse oximetry were initiated. Skin assessment completed. Orthostatic vitals completed. Will continue to monitor.

## 2017-04-06 NOTE — Consult Note (Signed)
Cardiology Consultation:   Patient ID: [name]; [id number]; 08-07-1946   Admit date: 04/05/2017 Date of Consult: 04/06/2017  Primary Care Provider: Shirline Frees, MD Primary Cardiologist: Dr. Marlou Porch Primary Electrophysiologist:  None   Patient Profile:   [name] is a 71 y.o. male with a hx of CAD s/p PCI in 2003, HTN, hyperlipidemia, prediabeteic, chronic diastolic HF who is being seen today for the evaluation of bradycardia at the request of Dr. Eliseo Squires, Lenna Sciara.  History of Present Illness:   [name] was last seen for a follow up appointment by Dr. Marlou Porch in the office October 03, 2016.  He had a myoview in 3/11 with no evidence for ischemia or infarction. Last echo in 2/14 showed EF 55-60% with mild MR.   He was admitted to the hospital on 04/05/17 for dizziness with near syncope. He has had no chest pain or SOB associated with this. An MRI of the brain was done in the ER which showed decreased flow into the right internal carotid with suspected proximal obstruction. He was noted to have asymptomatic bradycardia while on telemetry in the ER down to 39 bpm. Reviewing previous Cardiology Office visits, his pulse has ran in the 50's before. Pt admits to loosing intentional weight as he is a prediabetic by making dietary changes 6 months ago and being more active. He has been on  60 mg of Propranolol at home which he has been taking, last dose was 8/11.   EKG: 50  bpm Sinus rhythm RSR' in V1 or V2, right VCD or RVH    Past Medical History:  Diagnosis Date  . Arthritis   . Bronchitis   . Coronary artery disease    multiple PCIs. has had pinched diagonal with 80-90% ostial stenosis,  ETT myoview (3/11): 8/03, no ST changes, hypertensive BP response, no evidence for ischemia or infarction, EF 66%     . GERD (gastroesophageal reflux disease)   . HTN (hypertension)    sees Dr. Samara Snide  . Hyperlipidemia    low HDL  . Myocardial infarction [facility])    sees Dr. Marigene Ehlers   . 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 Tremor     Past Surgical History:  Procedure Laterality Date  . CORONARY ANGIOPLASTY WITH STENT PLACEMENT  2003  . HERNIA REPAIR  2/29/79   umbilical hernia repair  . INSERTION OF MESH N/A 10/18/2012   Procedure: INSERTION OF MESH;  Surgeon: Ralene Ok, MD;  Location: [place];  Service: General;  Laterality: N/A;  . UMBILICAL HERNIA REPAIR N/A 10/18/2012   Procedure: LAPAROSCOPIC UMBILICAL HERNIA;  Surgeon: Ralene Ok, MD;  Location: [place];  Service: General;  Laterality: N/A;     Inpatient Medications: Scheduled Meds: . aspirin EC  81 mg Oral Daily  . enoxaparin (LOVENOX) injection  40 mg Subcutaneous Q24H  . feeding supplement (ENSURE ENLIVE)  237 mL Oral BID BM  . isosorbide mononitrate  60 mg Oral Daily  . lisinopril  2.5 mg Oral Daily  . pantoprazole  40 mg Oral Daily  . rosuvastatin  5 mg Oral Daily  . sodium chloride flush  3 mL Intravenous Q12H   Continuous Infusions:  PRN Meds: acetaminophen  Allergies:    Allergies  Allergen Reactions  . Shellfish Allergy Nausea And Vomiting    Diaphoresis, weak, extreme vomiting   . Clopidogrel Bisulfate Hives    REACTION: rash  . Red Wine Complex [Germanium] Other (  See Comments)    unspecified    Social History:   Social History   Social History  . Marital status: Married    Spouse name: N/A  . Number of children: N/A  . Years of education: N/A   Occupational History  . Not on file.   Social History Main Topics  . Smoking status: Former Smoker    Quit date: 12/24/1975  . Smokeless tobacco: Never Used  . Alcohol use Yes     Comment: very seldom  . Drug use: No  . Sexual activity: Not on file   Other Topics Concern  . Not on file   Social History Narrative   Current disabled    Family History:   Family History  Problem Relation Age of Onset  . Stroke Mother   . Kidney disease Mother   . Diabetes Mother   . Cancer  Paternal Uncle        larynx  . Coronary artery disease Unknown        family history     ROS:  Please see the history of present illness.  ROS  All other ROS reviewed and negative.     Physical Exam/Data:   Vitals:   04/05/17 2154 04/05/17 2200 04/05/17 2201 04/06/17 0623  BP: (!) 128/99 132/71 130/67 125/60  Pulse: (!) 50 (!) 53 60 (!) 57  Resp: 17   17  Temp: 98 F (36.7 C)   97.8 F (36.6 C)  TempSrc: Oral   Oral  SpO2: 100% 100% 99% 100%  Weight: 158 lb 14.4 oz (72.1 kg)     Height: 5\' 10"  (1.778 m)       Intake/Output Summary (Last 24 hours) at 04/06/17 1011 Last data filed at 04/05/17 1941  Gross per 24 hour  Intake                0 ml  Output              200 ml  Net             -200 ml   Filed Weights   04/05/17 1206 04/05/17 2154  Weight: 165 lb (74.8 kg) 158 lb 14.4 oz (72.1 kg)   Body mass index is 22.8 kg/m.  General:  Well nourished, well developed, in no acute distress.  HEENT: normal Lymph: no adenopathy Neck: no JVD Endocrine:  No thryomegaly Vascular: No carotid bruits; FA pulses 2+ bilaterally without bruits  Cardiac:  normal S1, S2; RRR; no murmur.  Lungs:  clear to auscultation bilaterally, no wheezing, rhonchi or rales  Abd: soft, nontender, no hepatomegaly  Ext: no edema Musculoskeletal:  No deformities, BUE and BLE strength normal and equal Skin: warm and dry  Neuro:  CNs 2-12 intact, no focal abnormalities noted, baseline tremor Psych:  Normal affect   EKG:  The EKG was personally reviewed and demonstrates:  50  bpm Sinus rhythm RSR' in V1 or V2, right VCD or RVH  Telemetry:  Telemetry was personally reviewed and demonstrates:  Heart rates 40-59 bpm, sinus   Relevant CV Studies:  Echo pending this admission  2003 cardiac catheterization PROCEDURE: PTCA with stent placement in the mid right coronary artery.  INDICATIONS: [name] is a 71 year old male who is admitted to the hospital several hours ago with chest pain.  He ruled in for a non-Q wave myocardial infarction. He had recurrent pain this morning and was brought urgently to the cardiac catheterization laboratory. Catheterization by Dr. Johnsie Cancel  revealed significant coronary artery disease with a 99% stenosis of the mid right coronary artery with TIMI 2 flow. He also had moderate diffuse disease in the proximal and distal right coronary artery. He had significant disease in the left circumflex and moderate disease in the left anterior descending artery. After review of the images, we opted to proceed with percutaneous intervention of the right coronary artery.  DESCRIPTION OF PROCEDURE: A preexisting 6 French sheath in the right femoral artery was exchanged over a wire for a 7 [place] sheath. Heparin and Integrilin were administered per protocol. We used a 7 [place] guiding catheter with sideholes and a BMW wire. The lesion in the mid right coronary artery was predilated with a 3.0 x 20 mm Quantum balloon inflated to 10 atmospheres. We then deployed a 3.0 x 24 mm Express II stent across the lesion at a deployment pressure of 10 atmospheres. This stent was postdilated with a 3.0 x 20 mm Quantum balloon inflated to 18 atmospheres in the distal aspect of the stent, 22 atmospheres in the proximal aspect of the stent. We then went back with a 3.25 x 12 mm Quantum balloon and inflated this to 20 atmospheres in the very proximal edge of the stent and 12 atmospheres in the midstent. Final angiographic images revealed patency of the mid right coronary artery with 0% residual stenosis and TIMI 3 flow. There was moderate diffuse disease in the proximal and distal vessel which was unchanged.  COMPLICATIONS: None.  RESULTS: Successful PTCA with stent placement in the mid right coronary artery reducing a 99% stenosis with TIMI 2 flow to 0% residual with TIMI 3 flow.  PLAN: Integrilin will be continued for an additional 24 hours. Plavix  will be administered for a recommended 6 to 12 months. We anticipate staged percutaneous intervention of the left circumflex in three or four days. Dictated by: Allene Dillon, M.D. [facility]   Echocardiogram 09/30/2012  Study Conclusions  - Left ventricle: The cavity size was normal. Wall thickness was increased in a pattern of mild LVH. Systolic function was normal. The estimated ejection fraction was in the range of 55% to 60%. Wall motion was normal; there were no regional wall motion abnormalities. Doppler parameters are consistent with abnormal left ventricular relaxation (grade 1 diastolic dysfunction). - Aortic valve: Trivial regurgitation. - Mitral valve: There was mild systolic anterior motion of the chordal structures. Mild regurgitation. - Left atrium: The atrium was mildly dilated.  Laboratory Data:  Chemistry Recent Labs Lab 04/05/17 1209 04/06/17 0259  NA 138 144  K 3.8 3.3*  CL 108 109  CO2 23 29  GLUCOSE 117* 86  BUN 17 13  CREATININE 0.94 0.95  CALCIUM 8.5* 9.0  GFRNONAA >60 >60  GFRAA >60 >60  ANIONGAP 7 6    No results for input(s): PROT, ALBUMIN, AST, ALT, ALKPHOS, BILITOT in the last 168 hours. Hematology Recent Labs Lab 04/05/17 1209 04/06/17 0259  WBC 5.0 6.1  RBC 4.14* 4.34  HGB 13.4 13.7  HCT 39.6 41.1  MCV 95.7 94.7  MCH 32.4 31.6  MCHC 33.8 33.3  RDW 12.7 12.4  PLT 150 147*   Cardiac Enzymes Recent Labs Lab 04/05/17 2152  TROPONINI <0.03   No results for input(s): TROPIPOC in the last 168 hours.  BNPNo results for input(s): BNP, PROBNP in the last 168 hours.  DDimer No results for input(s): DDIMER in the last 168 hours.  Radiology/Studies:  Ct Angio Head W Or Wo Contrast  Result Date: 04/05/2017  CLINICAL DATA:  Atherosclerotic disease.  Near syncope and dizziness EXAM: CT ANGIOGRAPHY HEAD AND NECK TECHNIQUE: Multidetector CT imaging of the head and neck was performed using the standard protocol during bolus  administration of intravenous contrast. Multiplanar CT image reconstructions and MIPs were obtained to evaluate the vascular anatomy. Carotid stenosis measurements (when applicable) are obtained utilizing NASCET criteria, using the distal internal carotid diameter as the denominator. CONTRAST:  50 mL Isovue 370 IV COMPARISON:  . MRI and CT head 04/05/2017 FINDINGS: CTA NECK FINDINGS Aortic arch: Negative aortic arch and proximal great vessels Right carotid system: Right carotid system widely patent without significant atherosclerotic disease or stenosis Left carotid system: Left carotid system widely patent without significant atherosclerotic disease or stenosis Vertebral arteries: Both vertebral arteries widely patent to the basilar without stenosis Skeleton: Cervical spine degenerative change. No acute skeletal abnormality. Other neck: Negative Upper chest: Lung apices clear. Review of the MIP images confirms the above findings CTA HEAD FINDINGS Anterior circulation: Mild atherosclerotic calcification in the cavernous carotid bilaterally without significant stenosis. Anterior middle cerebral arteries are widely patent without significant stenosis. Mild atherosclerotic irregularity left M1 segment Posterior circulation: Both vertebral arteries are patent to the basilar with minimal atherosclerotic disease. PICA patent bilaterally. Basilar widely patent. Superior cerebellar and posterior cerebral artery is widely patent bilaterally. Venous sinuses: Negative Anatomic variants: None Delayed phase: Normal enhancement on delayed imaging. Review of the MIP images confirms the above findings IMPRESSION: No significant carotid or vertebral artery stenosis in the neck Mild atherosclerotic disease in the cavernous carotid bilaterally without stenosis. Mild atherosclerotic disease in the left M1 segment. No significant intracranial stenosis. Electronically Signed   By: Franchot Gallo M.D.   On: 04/05/2017 21:27   Dg Chest 2  View  Result Date: 04/05/2017 CLINICAL DATA:  Syncope EXAM: CHEST  2 VIEW COMPARISON:  10/18/2012 FINDINGS: The heart size and mediastinal contours are within normal limits. Both lungs are clear. The visualized skeletal structures are unremarkable. IMPRESSION: No active cardiopulmonary disease. Electronically Signed   By: Franchot Gallo M.D.   On: 04/05/2017 19:41   Ct Head Wo Contrast  Result Date: 04/05/2017 CLINICAL DATA:  TIA.  Near syncope and dizziness EXAM: CT HEAD WITHOUT CONTRAST TECHNIQUE: Contiguous axial images were obtained from the base of the skull through the vertex without intravenous contrast. COMPARISON:  None. FINDINGS: Brain: Mild atrophy. Negative for hydrocephalus. Mild chronic microvascular ischemic change in the white matter. Negative for acute infarct. Negative for hemorrhage or mass. Vascular: Atherosclerotic calcification. Negative for hyperdense vessel Skull: Negative Sinuses/Orbits: Negative Other: None IMPRESSION: Atrophy and mild chronic microvascular ischemia. Atherosclerotic cerebrovascular disease. No acute intracranial abnormality. Electronically Signed   By: Franchot Gallo M.D.   On: 04/05/2017 13:48   Ct Angio Neck W Or Wo Contrast  Result Date: 04/05/2017 CLINICAL DATA:  Atherosclerotic disease.  Near syncope and dizziness EXAM: CT ANGIOGRAPHY HEAD AND NECK TECHNIQUE: Multidetector CT imaging of the head and neck was performed using the standard protocol during bolus administration of intravenous contrast. Multiplanar CT image reconstructions and MIPs were obtained to evaluate the vascular anatomy. Carotid stenosis measurements (when applicable) are obtained utilizing NASCET criteria, using the distal internal carotid diameter as the denominator. CONTRAST:  50 mL Isovue 370 IV COMPARISON:  . MRI and CT head 04/05/2017 FINDINGS: CTA NECK FINDINGS Aortic arch: Negative aortic arch and proximal great vessels Right carotid system: Right carotid system widely patent  without significant atherosclerotic disease or stenosis Left carotid system: Left carotid system  widely patent without significant atherosclerotic disease or stenosis Vertebral arteries: Both vertebral arteries widely patent to the basilar without stenosis Skeleton: Cervical spine degenerative change. No acute skeletal abnormality. Other neck: Negative Upper chest: Lung apices clear. Review of the MIP images confirms the above findings CTA HEAD FINDINGS Anterior circulation: Mild atherosclerotic calcification in the cavernous carotid bilaterally without significant stenosis. Anterior middle cerebral arteries are widely patent without significant stenosis. Mild atherosclerotic irregularity left M1 segment Posterior circulation: Both vertebral arteries are patent to the basilar with minimal atherosclerotic disease. PICA patent bilaterally. Basilar widely patent. Superior cerebellar and posterior cerebral artery is widely patent bilaterally. Venous sinuses: Negative Anatomic variants: None Delayed phase: Normal enhancement on delayed imaging. Review of the MIP images confirms the above findings IMPRESSION: No significant carotid or vertebral artery stenosis in the neck Mild atherosclerotic disease in the cavernous carotid bilaterally without stenosis. Mild atherosclerotic disease in the left M1 segment. No significant intracranial stenosis. Electronically Signed   By: Franchot Gallo M.D.   On: 04/05/2017 21:27   Mr Brain Wo Contrast  Result Date: 04/05/2017 CLINICAL DATA:  Initial evaluation for acute dizziness. EXAM: MRI HEAD WITHOUT CONTRAST TECHNIQUE: Multiplanar, multiecho pulse sequences of the brain and surrounding structures were obtained without intravenous contrast. COMPARISON:  Prior CT from earlier the same day. FINDINGS: Brain: Generalized age-related cerebral atrophy. Mild chronic microvascular ischemic disease present within the periventricular white matter. Small remote lacunar infarct noted within  the. Tear white matter on the right. No evidence for acute or subacute ischemia. Gray-white matter differentiation maintained. No evidence for acute or chronic intracranial hemorrhage. No mass lesion, midline shift or mass effect. No hydrocephalus. No extra-axial fluid collection. Major dural sinuses are grossly patent. Pituitary gland suprasellar region within normal limits. Vascular: Major intracranial vascular flow voids are maintained. Minimal signal abnormality within the cavernous and petrous right ICA suspected be related to slow flow (series 6, image 9), which may reflect a proximal stenosis. Skull and upper cervical spine: Craniocervical junction within normal limits. Visualized upper cervical spine unremarkable. Bone marrow signal intensity within normal limits. No scalp soft tissue abnormality. Sinuses/Orbits: Globes and oval soft tissues within normal limits. Paranasal sinuses are clear. Trace fluid noted within the bilateral mastoid air cells, of doubtful significance. Inner ear structures grossly normal. IMPRESSION: 1. No acute intracranial infarct or other process identified. 2. Mild loss of flow related signal within the cavernous right ICA, which may be related to slow flow. Findings suggests a possible proximal stenosis. Finding could be further assessed with dedicated carotid Doppler ultrasound and/or vascular imaging of the neck as clinically desired. 3. Age appropriate cerebral atrophy with mild chronic small vessel ischemic disease. Electronically Signed   By: Jeannine Boga M.D.   On: 04/05/2017 17:17    Assessment and Plan:   1. Near Syncope: MRI is showing mild loss of flow to right ICA,  CT angio of the neck reveals only mild carotid stenosis. This is not likely the cause of his symptoms  2. Bradycardia: Heart rates have been between 35-60, he was on Propranolol at home 60 mg ER and has been intentionally loosing weight to be healthier this dose may be to aggressive now.  BB,  is currently discontinued.  His heart rate is upper 50s now. Discussed with pharmacy, medication should be out of his symptoms within 24 hours.  3. CAD s/p PCI in 2003: He has had atypical chest pains in the past with some intermittent shortness of breath but these do  not appear to be consistent with ACS. Unchanged symptoms since his last visit with Dr. Marlou Porch 10/03/2016.   ---  Negative troponin. --   2003 Cath report can be seen above. --   Will need a 30 day cath monitor  4. Chronic diastolic HF: Echo pending for this admission. Most recent echo in 2014 EF 55-60% with mild MR and grade 1 dd.  5. Hypokalemia: Will replace potassium.  Kristopher Glee, PA-C  04/06/2017 @

## 2017-04-06 NOTE — Progress Notes (Signed)
  Echocardiogram 2D Echocardiogram has been performed.  [name] 04/06/2017, 1:37 PM

## 2017-04-10 ENCOUNTER — Ambulatory Visit (INDEPENDENT_AMBULATORY_CARE_PROVIDER_SITE_OTHER): Payer: Medicare Other

## 2017-04-10 DIAGNOSIS — I4510 Unspecified right bundle-branch block: Secondary | ICD-10-CM | POA: Diagnosis not present

## 2017-04-10 DIAGNOSIS — R42 Dizziness and giddiness: Secondary | ICD-10-CM

## 2017-04-10 DIAGNOSIS — R001 Bradycardia, unspecified: Secondary | ICD-10-CM

## 2017-05-10 NOTE — Progress Notes (Signed)
Cardiology Office Note   Date:  05/12/2017   ID:  Tyler, [name] [birth year], MRN [id number]  PCP:  Shirline Frees, MD  Cardiologist:  Dr. Marlou Porch     Chief Complaint  Patient presents with  . Near Syncope      History of Present Illness: [name] is a 71 y.o. male who presents for post hospital with admit for near syncope, BB was stopped.   He has a history of coronary artery disease status post PCI to the RCA in 2003. Last Myoview was in March 2011 which showed no ischemia. EF was 55-60% by echo in 2014 with mild MR. He is known to have an incomplete right bundle branch block with a QRS duration 90 ms range. He recently was presyncopal and was admitted for evaluation of this. He was noted to be bradycardic with heart rates in the upper 30s and had had imaging including CT angiogram and MRI. This showed mild bilateral carotid artery stenosis, not likely playing a role in his presyncope. He was on propranolol XR 60 mg which was discontinued and heart rate has subsequently increased up to 50 beats per minute prior to discharge.    Today he reports being asymptomatic at this time.  He does notice mild dizziness if he stands.  Nothing like prior to admit.  No chest pain and no SOB.  He has felt well.  He just completed wearing a 30 day event monitor.  Off BB he has a mild tremor that has returned.  Not severe though.    Past Medical History:  Diagnosis Date  . Arthritis   . Bronchitis   . Coronary artery disease    multiple PCIs. has had pinched diagonal with 80-90% ostial stenosis,  ETT myoview (3/11): 8/03, no ST changes, hypertensive BP response, no evidence for ischemia or infarction, EF 66%     . GERD (gastroesophageal reflux disease)   . HTN (hypertension)    sees Dr. Samara Snide  . Hyperlipidemia    low HDL  . Myocardial infarction [facility])    sees Dr. Marigene Ehlers  . 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 Tremor     Past  Surgical History:  Procedure Laterality Date  . CORONARY ANGIOPLASTY WITH STENT PLACEMENT  2003  . HERNIA REPAIR  3/71/69   umbilical hernia repair  . INSERTION OF MESH N/A 10/18/2012   Procedure: INSERTION OF MESH;  Surgeon: Ralene Ok, MD;  Location: [facility];  Service: General;  Laterality: N/A;  . UMBILICAL HERNIA REPAIR N/A 10/18/2012   Procedure: LAPAROSCOPIC UMBILICAL HERNIA;  Surgeon: Ralene Ok, MD;  Location: [facility];  Service: General;  Laterality: N/A;     Current Outpatient Prescriptions  Medication Sig Dispense Refill  . acetaminophen (TYLENOL) 500 MG tablet Take 500-1,000 mg by mouth every 6 (six) hours as needed for pain.    Marland Kitchen aspirin EC 81 MG tablet Take 81 mg by mouth daily.    Marland Kitchen EPINEPHrine (EPIPEN 2-PAK) 0.3 mg/0.3 mL DEVI Inject 0.3 mg into the muscle daily as needed (Anaphylaxis).    . isosorbide mononitrate (IMDUR) 60 MG 24 hr tablet TAKE 1 TABLET BY MOUTH DAILY 90 tablet 3  . lisinopril (PRINIVIL,ZESTRIL) 2.5 MG tablet Take 1 tablet (2.5 mg total) by mouth daily. 90 tablet 2  . nitroGLYCERIN (NITROSTAT) 0.4 MG SL tablet Place 1 tablet (0.4 mg total) under the tongue every 5 (five) minutes as needed for chest pain.  25 tablet 1  . omeprazole (PRILOSEC) 20 MG capsule Take 20 mg by mouth daily.    . rosuvastatin (CRESTOR) 5 MG tablet TAKE 1 TABLET(5 MG) BY MOUTH DAILY 90 tablet 2   No current facility-administered medications for this visit.     Allergies:   Shellfish allergy; Clopidogrel bisulfate; and Red wine complex [germanium]    Social History:  The patient  reports that he quit smoking about 41 years ago. He has never used smokeless tobacco. He reports that he drinks alcohol. He reports that he does not use drugs.   Family History:  The patient's family history includes Cancer in his paternal uncle; Coronary artery disease in his unknown relative; Diabetes in his mother; Kidney disease in his mother; Stroke in his mother.    ROS:  General:no colds or  fevers, some wt loss  Skin:no rashes or ulcers HEENT:no blurred vision, no congestion CV:see HPI PUL:see HPI GI:no diarrhea constipation or melena, no indigestion GU:no hematuria, no dysuria MS:no joint pain, no claudication Neuro:no syncope, no lightheadedness Endo:no diabetes, no thyroid disease  Wt Readings from Last 3 Encounters:  05/11/17 174 lb 12.8 oz (79.3 kg)  04/05/17 158 lb 14.4 oz (72.1 kg)  04/05/17 165 lb (74.8 kg)     PHYSICAL EXAM: VS:  BP 114/64   Pulse 64   Ht 5\' 11"  (1.803 m)   Wt 174 lb 12.8 oz (79.3 kg) Comment: heavy boots  BMI 24.38 kg/m  , BMI Body mass index is 24.38 kg/m. General:Pleasant affect, NAD Skin:Warm and dry, brisk capillary refill HEENT:normocephalic, sclera clear, mucus membranes moist Neck:supple, no JVD, no bruits  Heart:S1S2 RRR without murmur, gallup, rub or click Lungs:clear without rales, rhonchi, or wheezes CWC:BJSE, non tender, + BS, do not palpate liver spleen or masses Ext:no lower ext edema, 2+ pedal pulses, 2+ radial pulses Neuro:alert and oriented, MAE, follows commands, + facial symmetry    EKG:  EKG is NOT ordered today.   Recent Labs: 05/11/2017: BUN 17; Creatinine, Ser 0.87; Hemoglobin WILL FOLLOW; Platelets WILL FOLLOW; Potassium 4.4; Sodium 143    Lipid Panel    Component Value Date/Time   CHOL 112 05/11/2017 1035   TRIG 87 05/11/2017 1035   HDL 34 (L) 05/11/2017 1035   CHOLHDL 3.3 05/11/2017 1035   CHOLHDL 4 01/29/2015 0849   VLDL 18.8 01/29/2015 0849   LDLCALC 61 05/11/2017 1035       Other studies Reviewed: Additional studies/ records that were reviewed today include: . Echo:  04/06/17 Study Conclusions  - Left ventricle: The cavity size was normal. Wall thickness was   increased in a pattern of mild LVH. Systolic function was normal.   The estimated ejection fraction was in the range of 55% to 60%.   Wall motion was normal; there were no regional wall motion   abnormalities. Doppler  parameters are consistent with abnormal   left ventricular relaxation (grade 1 diastolic dysfunction). - Mitral valve: There was systolic anterior motion of the chordal   structures. There was mild regurgitation.  Impressions:  - Normal LV systolic function; mild diastolic dysfunction; mild   LVH; mild chordal SAM; mild MR.   ASSESSMENT AND PLAN:  1.  Syncope - no further episodes since stopping BB  30 day event monitor is pending depending on results will follow up with Dr. Marlou Porch in 2-3 months.  2  Bradycardia improved off BB.  Has hx of RBBB  3. Mixed hyperlipidemia --will check lipids today   4.  HTN controlled  5. CAD native vessel hx of PCI in 2003 with non Q wave MI   6.  Tremor, increased off BB.  7. Hypokalemia recheck BMP     Current medicines are reviewed with the patient today.  The patient Has no concerns regarding medicines.  The following changes have been made:  See above Labs/ tests ordered today include:see above  Disposition:   FU:  see above  Signed, Cecilie Kicks, NP  05/12/2017 10:38 PM    [address], [place], [place], [place] Phone: [phone]; Fax: [phone]

## 2017-05-11 ENCOUNTER — Ambulatory Visit (INDEPENDENT_AMBULATORY_CARE_PROVIDER_SITE_OTHER): Payer: Medicare Other | Admitting: Cardiology

## 2017-05-11 ENCOUNTER — Encounter (INDEPENDENT_AMBULATORY_CARE_PROVIDER_SITE_OTHER): Payer: Self-pay

## 2017-05-11 ENCOUNTER — Encounter: Payer: Self-pay | Admitting: Cardiology

## 2017-05-11 VITALS — BP 114/64 | HR 64 | Ht 71.0 in | Wt 174.8 lb

## 2017-05-11 DIAGNOSIS — E876 Hypokalemia: Secondary | ICD-10-CM | POA: Diagnosis not present

## 2017-05-11 DIAGNOSIS — I10 Essential (primary) hypertension: Secondary | ICD-10-CM

## 2017-05-11 DIAGNOSIS — R001 Bradycardia, unspecified: Secondary | ICD-10-CM

## 2017-05-11 DIAGNOSIS — R251 Tremor, unspecified: Secondary | ICD-10-CM | POA: Diagnosis not present

## 2017-05-11 DIAGNOSIS — E782 Mixed hyperlipidemia: Secondary | ICD-10-CM

## 2017-05-11 DIAGNOSIS — R55 Syncope and collapse: Secondary | ICD-10-CM

## 2017-05-11 LAB — SPECIMEN STATUS

## 2017-05-11 NOTE — Patient Instructions (Signed)
Medication Instructions: Your physician recommends that you continue on your current medications as directed. Please refer to the Current Medication list given to you today.  Labwork: Your physician recommends that you have lab work today: BMET and Lipid Panel  Procedures/Testing: None Ordered  Follow-Up: Your physician recommends that you schedule a follow-up appointment in: 2-3 MONTHS with Dr. Marlou Porch - MUST BE DR. Marlou Porch.   If you need a refill on your cardiac medications before your next appointment, please call your pharmacy.

## 2017-05-12 ENCOUNTER — Encounter: Payer: Self-pay | Admitting: Cardiology

## 2017-06-04 LAB — BASIC METABOLIC PANEL
BUN/Creatinine Ratio: 20 (ref 10–24)
BUN: 17 mg/dL (ref 8–27)
CALCIUM: 9.3 mg/dL (ref 8.6–10.2)
CHLORIDE: 106 mmol/L (ref 96–106)
CO2: 25 mmol/L (ref 20–29)
Creatinine, Ser: 0.87 mg/dL (ref 0.76–1.27)
GFR calc non Af Amer: 87 mL/min/{1.73_m2} (ref >59)
GFR, EST AFRICAN AMERICAN: 101 mL/min/{1.73_m2} (ref >59)
Glucose: 89 mg/dL (ref 65–99)
Potassium: 4.4 mmol/L (ref 3.5–5.2)
Sodium: 143 mmol/L (ref 134–144)

## 2017-06-04 LAB — LIPID PANEL
Chol/HDL Ratio: 3.3 ratio (ref 0.0–5.0)
Cholesterol, Total: 112 mg/dL (ref 100–199)
HDL: 34 mg/dL — AB (ref >39)
LDL Calculated: 61 mg/dL (ref 0–99)
Triglycerides: 87 mg/dL (ref 0–149)
VLDL CHOLESTEROL CAL: 17 mg/dL (ref 5–40)

## 2017-08-14 ENCOUNTER — Ambulatory Visit: Payer: Medicare Other | Admitting: Cardiology

## 2017-08-14 ENCOUNTER — Encounter: Payer: Self-pay | Admitting: Cardiology

## 2017-08-14 ENCOUNTER — Encounter (INDEPENDENT_AMBULATORY_CARE_PROVIDER_SITE_OTHER): Payer: Self-pay

## 2017-08-14 VITALS — BP 130/80 | HR 80 | Ht 71.0 in | Wt 165.4 lb

## 2017-08-14 DIAGNOSIS — E782 Mixed hyperlipidemia: Secondary | ICD-10-CM | POA: Diagnosis not present

## 2017-08-14 DIAGNOSIS — I209 Angina pectoris, unspecified: Secondary | ICD-10-CM

## 2017-08-14 DIAGNOSIS — R251 Tremor, unspecified: Secondary | ICD-10-CM | POA: Diagnosis not present

## 2017-08-14 DIAGNOSIS — I10 Essential (primary) hypertension: Secondary | ICD-10-CM

## 2017-08-14 MED ORDER — PROPRANOLOL HCL ER 60 MG PO CP24: 60.0000 mg | ORAL_CAPSULE | Freq: Every day | ORAL | 3 refills | Status: DC

## 2017-08-14 NOTE — Progress Notes (Signed)
Cardiology Office Note    Date:  08/14/2017   ID:  Trevontae, Lindahl 12/05/1945, MRN [id number]  PCP:  Shirline Frees, MD  Cardiologist:   Candee Furbish, MD     History of Present Illness:  JOSIP MEROLLA is a 71 y.o. male former patient of Dr. Olevia Perches, Aund[facility] Dubin  history of CAD s/p PCI as well as HTN presents for cardiology followup. He had a myoview in 3/11 with no evidence for ischemia or infarction.   Last echo in 2/14 showed EF 55-60% with mild MR.  BP high today but usually runs on the low side, typically SBP 100s. No exertional dyspnea.  He gets tightness in his chest when bending over to pick up something.  He says that this has been present for years and attributes it to his hiatal hernia.  No change in this pattern.  5/6 "blockages were fixed "  Lost 40 pounds - prediabetic. A1c 5.3  08/14/17-has had a 40 pound overall weight loss.  Doing great.  Previously we had to stop the Inderal because of some bradycardia.  We are going to resume at low-dose 60 mg extended release to help him with his essential tremor which has worsened.  We are stopping the lisinopril 2.5 mg because of low normal blood pressures in the past.  Continuing with isosorbide.   Past Medical History:  Diagnosis Date  . Arthritis   . Bronchitis   . Coronary artery disease    multiple PCIs. has had pinched diagonal with 80-90% ostial stenosis,  ETT myoview (3/11): 8/03, no ST changes, hypertensive BP response, no evidence for ischemia or infarction, EF 66%     . GERD (gastroesophageal reflux disease)   . HTN (hypertension)    sees Dr. Samara Snide  . Hyperlipidemia    low HDL  . Myocardial infarction [facility])    sees Dr. Marigene Ehlers  . 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 Tremor     Past Surgical History:  Procedure Laterality Date  . CORONARY ANGIOPLASTY WITH STENT PLACEMENT  2003  . HERNIA REPAIR  3/38/25   umbilical hernia repair  . INSERTION OF MESH N/A  10/18/2012   Procedure: INSERTION OF MESH;  Surgeon: Ralene Ok, MD;  Location: [facility];  Service: General;  Laterality: N/A;  . UMBILICAL HERNIA REPAIR N/A 10/18/2012   Procedure: LAPAROSCOPIC UMBILICAL HERNIA;  Surgeon: Ralene Ok, MD;  Location: [facility];  Service: General;  Laterality: N/A;    Current Medications: Outpatient Medications Prior to Visit  Medication Sig Dispense Refill  . acetaminophen (TYLENOL) 500 MG tablet Take 500-1,000 mg by mouth every 6 (six) hours as needed for pain.    Marland Kitchen aspirin EC 81 MG tablet Take 81 mg by mouth daily.    Marland Kitchen EPINEPHrine (EPIPEN 2-PAK) 0.3 mg/0.3 mL DEVI Inject 0.3 mg into the muscle daily as needed (Anaphylaxis).    Marland Kitchen EPINEPHrine 0.3 mg/0.3 mL IJ SOAJ injection Inject into the skin as needed.  1  . isosorbide mononitrate (IMDUR) 60 MG 24 hr tablet TAKE 1 TABLET BY MOUTH DAILY 90 tablet 3  . nitroGLYCERIN (NITROSTAT) 0.4 MG SL tablet Place 1 tablet (0.4 mg total) under the tongue every 5 (five) minutes as needed for chest pain. 25 tablet 1  . omeprazole (PRILOSEC) 20 MG capsule Take 20 mg by mouth daily.    . rosuvastatin (CRESTOR) 5 MG tablet TAKE 1 TABLET(5 MG) BY MOUTH DAILY 90 tablet 2  .  lisinopril (PRINIVIL,ZESTRIL) 2.5 MG tablet Take 1 tablet (2.5 mg total) by mouth daily. 90 tablet 2   No facility-administered medications prior to visit.      Allergies:   Shellfish allergy; Clopidogrel bisulfate; and Red wine complex [germanium]   Social History   Socioeconomic History  . Marital status: Married    Spouse name: None  . Number of children: None  . Years of education: None  . Highest education level: None  Social Needs  . Financial resource strain: None  . Food insecurity - worry: None  . Food insecurity - inability: None  . Transportation needs - medical: None  . Transportation needs - non-medical: None  Occupational History  . None  Tobacco Use  . Smoking status: Former Smoker    Last attempt to quit: 12/24/1975    Years  since quitting: 41.6  . Smokeless tobacco: Never Used  Substance and Sexual Activity  . Alcohol use: Yes    Comment: very seldom  . Drug use: No  . Sexual activity: None  Other Topics Concern  . None  Social History Narrative   Current disabled     Family History:  The patient's family history includes Cancer in his paternal uncle; Coronary artery disease in his unknown relative; Diabetes in his mother; Kidney disease in his mother; Stroke in his mother.   ROS:   Please see the history of present illness.    ROS All other systems reviewed and are negative.   PHYSICAL EXAM:   VS:  BP 130/80   Pulse 80   Ht 5\' 11"  (1.803 m)   Wt 165 lb 6.4 oz (75 kg)   SpO2 99%   BMI 23.07 kg/m    GEN: Well nourished, well developed, in no acute distress  HEENT: normal  Neck: no JVD, carotid bruits, or masses Cardiac: RRR; no murmurs, rubs, or gallops,no edema  Respiratory:  clear to auscultation bilaterally, normal work of breathing GI: soft, nontender, nondistended, + BS MS: no deformity or atrophy  Skin: warm and dry, no rash Neuro:  Alert and Oriented x 3, Strength and sensation are intact Psych: euthymic mood, full affect   Wt Readings from Last 3 Encounters:  08/14/17 165 lb 6.4 oz (75 kg)  05/11/17 174 lb 12.8 oz (79.3 kg)  04/05/17 158 lb 14.4 oz (72.1 kg)      Studies/Labs Reviewed:   EKG:  EKG is ordered today.  The ekg ordered today demonstrates Sinus bradycardia rate 46 with no other abnormalities. Personally viewed. Since/tracing heart rate is slower.  Recent Labs: 05/11/2017: BUN 17; Creatinine, Ser 0.87; Hemoglobin WILL FOLLOW; Platelets WILL FOLLOW; Potassium 4.4; Sodium 143   Lipid Panel    Component Value Date/Time   CHOL 112 05/11/2017 1035   TRIG 87 05/11/2017 1035   HDL 34 (L) 05/11/2017 1035   CHOLHDL 3.3 05/11/2017 1035   CHOLHDL 4 01/29/2015 0849   VLDL 18.8 01/29/2015 0849   LDLCALC 61 05/11/2017 1035    Additional studies/ records that were  reviewed today include:  Prior officeNotes, lab work, EKG reviewed  ECHO: 09/30/12  - Left ventricle: The cavity size was normal. Wall thickness was increased in a pattern of mild LVH. Systolic function was normal. The estimated ejection fraction was in the range of 55% to 60%. Wall motion was normal; there were no regional wall motion abnormalities. Doppler parameters are consistent with abnormal left ventricular relaxation (grade 1 diastolic dysfunction). - Aortic valve: Trivial regurgitation. - Mitral valve: There  was mild systolic anterior motion of the chordal structures. Mild regurgitation. - Left atrium: The atrium was mildly dilated.   ASSESSMENT:    1. Angina pectoris ([place])   2. Tremor   3. Essential hypertension   4. Mixed hyperlipidemia      PLAN:  In order of problems listed above:  CAD  - Stable.  Has mild occasional atypical chest pain in the past, no change to this pattern.  He thinks sometimes this could be associated with his hiatal hernia which has improved with weight loss.  Overall doing well.  HYPERLIPIDEMIA   - Myalgias with simvastatin, doing fine on Crestor.  Good lipids, LDL 61 excellent  ESSENTIAL HYPERTENSION  - Medications reviewed.   -Because of his essential tremor which has worsened after stopping the Inderal, we will restarted at half dose 60 mg extended release.  Stopping lisinopril 2.5.  ANGINA  - Well controlled with isosorbide  PREDIABETES  - good weight loss.  Excellent hemoglobin A1c.  Essential tremor -Resuming low-dose Imdur 60 mg.  Remember he did have sinus bradycardia previously.  We will watch.  Medication Adjustments/Labs and Tests Ordered: Current medicines are reviewed at length with the patient today.  Concerns regarding medicines are outlined above.  Medication changes, Labs and Tests ordered today are listed in the Patient Instructions below. Patient Instructions  Medication Instructions:  1. STOP  LISINOPRIL   2. START INDERAL ER 60 MG DAILY; RX HAS BEEN   Labwork: NONE ORDERED TODAY  Testing/Procedures: NONE ORDERED TODAY  Follow-Up: Your physician wants you to follow-up in: [place], [place] You will receive a reminder letter in the mail two months in advance. If you don't receive a letter, please call our office to schedule the follow-up appointment.   Any Other Special Instructions Will Be Listed Below (If Applicable).     If you need a refill on your cardiac medications before your next appointment, please call your pharmacy.      Signed, Candee Furbish, MD  08/14/2017 9:09 AM    [facility] [place], [place], [place]  [address] Phone: [phone]; Fax: [phone]

## 2017-08-14 NOTE — Patient Instructions (Signed)
Medication Instructions:  1. STOP LISINOPRIL   2. START INDERAL ER 60 MG DAILY; RX HAS BEEN   Labwork: NONE ORDERED TODAY  Testing/Procedures: NONE ORDERED TODAY  Follow-Up: Your physician wants you to follow-up in: [place], [place] You will receive a reminder letter in the mail two months in advance. If you don't receive a letter, please call our office to schedule the follow-up appointment.   Any Other Special Instructions Will Be Listed Below (If Applicable).     If you need a refill on your cardiac medications before your next appointment, please call your pharmacy.

## 2017-09-30 ENCOUNTER — Ambulatory Visit
Admission: RE | Admit: 2017-09-30 | Discharge: 2017-09-30 | Disposition: A | Discharge status: Home / Self Care | Payer: Medicare Other | Source: Ambulatory Visit | Attending: Family Medicine | Admitting: Family Medicine

## 2017-09-30 ENCOUNTER — Other Ambulatory Visit: Payer: Self-pay | Admitting: Family Medicine

## 2017-09-30 DIAGNOSIS — R059 Cough, unspecified: Secondary | ICD-10-CM

## 2017-09-30 DIAGNOSIS — R05 Cough: Secondary | ICD-10-CM

## 2017-10-31 ENCOUNTER — Encounter: Payer: Self-pay | Admitting: Gastroenterology

## 2018-01-01 ENCOUNTER — Other Ambulatory Visit: Payer: Self-pay | Admitting: Internal Medicine

## 2018-01-16 ENCOUNTER — Other Ambulatory Visit: Payer: Self-pay | Admitting: Cardiology

## 2018-04-09 ENCOUNTER — Encounter: Payer: Self-pay | Admitting: Gastroenterology

## 2018-04-09 ENCOUNTER — Ambulatory Visit (AMBULATORY_SURGERY_CENTER): Payer: Self-pay | Admitting: *Deleted

## 2018-04-09 VITALS — Ht 72.0 in | Wt 169.0 lb

## 2018-04-09 DIAGNOSIS — Z1211 Encounter for screening for malignant neoplasm of colon: Secondary | ICD-10-CM

## 2018-04-09 MED ORDER — NA SULFATE-K SULFATE-MG SULF 17.5-3.13-1.6 GM/177ML PO SOLN: 1.0000 | Freq: Once | ORAL | 0 refills | Status: AC

## 2018-04-09 NOTE — Progress Notes (Signed)
Patient denies any allergies to eggs or soy. Patient denies any problems with anesthesia/sedation. Patient denies any oxygen use at home. Patient denies taking any diet/weight loss medications or blood thinners. EMMI education offered, pt declined.  

## 2018-04-21 ENCOUNTER — Ambulatory Visit (AMBULATORY_SURGERY_CENTER): Payer: Medicare Other | Admitting: Gastroenterology

## 2018-04-21 ENCOUNTER — Encounter: Payer: Self-pay | Admitting: Gastroenterology

## 2018-04-21 VITALS — BP 91/55 | HR 52 | Temp 98.0°F | Resp 13 | Ht 72.0 in | Wt 164.0 lb

## 2018-04-21 DIAGNOSIS — Z1211 Encounter for screening for malignant neoplasm of colon: Secondary | ICD-10-CM

## 2018-04-21 DIAGNOSIS — D123 Benign neoplasm of transverse colon: Secondary | ICD-10-CM

## 2018-04-21 DIAGNOSIS — D120 Benign neoplasm of cecum: Secondary | ICD-10-CM

## 2018-04-21 MED ORDER — SODIUM CHLORIDE 0.9 % IV SOLN: 500.0000 mL | Freq: Once | INTRAVENOUS | Status: DC

## 2018-04-21 NOTE — Patient Instructions (Signed)
HANDOUTS GIVEN FOR POLYPS, DIVERTICULOSIS, HEMORRHOIDS AND HIGH FIBER DIET.  YOU HAD AN ENDOSCOPIC PROCEDURE TODAY AT THE [facility]:   Refer to the procedure report that was given to you for any specific questions about what was found during the examination.  If the procedure report does not answer your questions, please call your gastroenterologist to clarify.  If you requested that your care partner not be given the details of your procedure findings, then the procedure report has been included in a sealed envelope for you to review at your convenience later.  YOU SHOULD EXPECT: Some feelings of bloating in the abdomen. Passage of more gas than usual.  Walking can help get rid of the air that was put into your GI tract during the procedure and reduce the bloating. If you had a lower endoscopy (such as a colonoscopy or flexible sigmoidoscopy) you may notice spotting of blood in your stool or on the toilet paper. If you underwent a bowel prep for your procedure, you may not have a normal bowel movement for a few days.  Please Note:  You might notice some irritation and congestion in your nose or some drainage.  This is from the oxygen used during your procedure.  There is no need for concern and it should clear up in a day or so.  SYMPTOMS TO REPORT IMMEDIATELY:   Following lower endoscopy (colonoscopy or flexible sigmoidoscopy):  Excessive amounts of blood in the stool  Significant tenderness or worsening of abdominal pains  Swelling of the abdomen that is new, acute  Fever of 100F or higher   For urgent or emergent issues, a gastroenterologist can be reached at any hour by calling [phone].   DIET:  We do recommend a small meal at first, but then you may proceed to your regular diet.  Drink plenty of fluids but you should avoid alcoholic beverages for 24 hours.  ACTIVITY:  You should plan to take it easy for the rest of today and you should NOT DRIVE or use heavy  machinery until tomorrow (because of the sedation medicines used during the test).    FOLLOW UP: Our staff will call the number listed on your records the next business day following your procedure to check on you and address any questions or concerns that you may have regarding the information given to you following your procedure. If we do not reach you, we will leave a message.  However, if you are feeling well and you are not experiencing any problems, there is no need to return our call.  We will assume that you have returned to your regular daily activities without incident.  If any biopsies were taken you will be contacted by phone or by letter within the next 1-3 weeks.  Please call us at [phone] if you have not heard about the biopsies in 3 weeks.    SIGNATURES/CONFIDENTIALITY: You and/or your care partner have signed paperwork which will be entered into your electronic medical record.  These signatures attest to the fact that that the information above on your After Visit Summary has been reviewed and is understood.  Full responsibility of the confidentiality of this discharge information lies with you and/or your care-partner. 

## 2018-04-21 NOTE — Op Note (Signed)
[place] Patient Name: [name] Procedure Date: 04/21/2018 10:19 AM MRN: [id number] Endoscopist: Ladene Artist , MD Age: 72 Referring MD:  Date of Birth: 23-Mar-1946 Gender: Male Account #: [id number] Procedure:                Colonoscopy Indications:              Screening for colorectal malignant neoplasm Medicines:                Monitored Anesthesia Care Procedure:                Pre-Anesthesia Assessment:                           - Prior to the procedure, a History and Physical                            was performed, and patient medications and                            allergies were reviewed. The patient's tolerance of                            previous anesthesia was also reviewed. The risks                            and benefits of the procedure and the sedation                            options and risks were discussed with the patient.                            All questions were answered, and informed consent                            was obtained. Prior Anticoagulants: The patient has                            taken no previous anticoagulant or antiplatelet                            agents. ASA Grade Assessment: II - A patient with                            mild systemic disease. After reviewing the risks                            and benefits, the patient was deemed in                            satisfactory condition to undergo the procedure.                           After obtaining informed consent, the colonoscope  was passed under direct vision. Throughout the                            procedure, the patient's blood pressure, pulse, and                            oxygen saturations were monitored continuously. The                            Colonoscope was introduced through the anus and                            advanced to the the cecum, identified by                            appendiceal orifice and  ileocecal valve. The                            ileocecal valve, appendiceal orifice, and rectum                            were photographed. The quality of the bowel                            preparation was good. The colonoscopy was performed                            without difficulty. The patient tolerated the                            procedure well. Scope In: 10:26:20 AM Scope Out: 10:41:09 AM Scope Withdrawal Time: 0 hours 10 minutes 58 seconds  Total Procedure Duration: 0 hours 14 minutes 49 seconds  Findings:                 The perianal and digital rectal examinations were                            normal.                           Two sessile polyps were found in the transverse                            colon and cecum. The polyps were 4 mm in size.                            These polyps were removed with a cold biopsy                            forceps. Resection and retrieval were complete.                           A few medium-mouthed diverticula were found in the  left colon.                           Internal hemorrhoids were found during                            retroflexion. The hemorrhoids were small and Grade                            I (internal hemorrhoids that do not prolapse).                           The exam was otherwise without abnormality on                            direct and retroflexion views. Complications:            No immediate complications. Estimated blood loss:                            None. Estimated Blood Loss:     Estimated blood loss: none. Impression:               - Two 4 mm polyps in the transverse colon and in                            the cecum, removed with a cold biopsy forceps.                            Resected and retrieved.                           - Diverticulosis in the left colon.                           - Internal hemorrhoids.                           - The examination was  otherwise normal on direct                            and retroflexion views. Recommendation:           - Repeat colonoscopy in 5 years for surveillance if                            polyp(s) are precancerous, otherwise no plans for                            future screening colonoscopy due to age.                           - Patient has a contact number available for                            emergencies. The signs and symptoms of potential  delayed complications were discussed with the                            patient. Return to normal activities tomorrow.                            Written discharge instructions were provided to the                            patient.                           - High fiber diet.                           - Continue present medications.                           - Await pathology results. Ladene Artist, MD 04/21/2018 10:45:02 AM This report has been signed electronically.

## 2018-04-21 NOTE — Progress Notes (Signed)
Called to room to assist during endoscopic procedure.  Patient ID and intended procedure confirmed with present staff. Received instructions for my participation in the procedure from the performing physician.  

## 2018-04-21 NOTE — Progress Notes (Signed)
Pt's states no medical or surgical changes since previsit or office visit. 

## 2018-04-22 ENCOUNTER — Telehealth: Payer: Self-pay

## 2018-04-22 ENCOUNTER — Telehealth: Payer: Self-pay | Admitting: *Deleted

## 2018-04-22 NOTE — Telephone Encounter (Signed)
  Follow up Call-  Call back number 04/21/2018  Post procedure Call Back phone  # [phone]  Permission to leave phone message Yes  Some recent data might be hidden     Patient questions:  Do you have a fever, pain , or abdominal swelling? No. Pain Score  0 *  Have you tolerated food without any problems? Yes.    Have you been able to return to your normal activities? Yes.    Do you have any questions about your discharge instructions: Diet   No. Medications  No. Follow up visit  No.  Do you have questions or concerns about your Care? No.  Actions: * If pain score is 4 or above: No action needed, pain <4.

## 2018-04-22 NOTE — Telephone Encounter (Signed)
  Follow up Call-  Call back number 04/21/2018  Post procedure Call Back phone  # [phone]  Permission to leave phone message Yes  Some recent data might be hidden     Left message

## 2018-05-05 ENCOUNTER — Encounter: Payer: Self-pay | Admitting: Gastroenterology

## 2018-07-29 ENCOUNTER — Other Ambulatory Visit: Payer: Self-pay | Admitting: Cardiology

## 2018-08-04 ENCOUNTER — Other Ambulatory Visit: Payer: Self-pay | Admitting: Cardiology

## 2018-09-02 ENCOUNTER — Other Ambulatory Visit: Payer: Self-pay | Admitting: Cardiology

## 2018-09-06 ENCOUNTER — Other Ambulatory Visit: Payer: Self-pay | Admitting: Cardiology

## 2018-09-06 MED ORDER — ISOSORBIDE MONONITRATE ER 60 MG PO TB24: 60.0000 mg | ORAL_TABLET | Freq: Every day | ORAL | 0 refills | Status: DC

## 2018-09-06 MED ORDER — PROPRANOLOL HCL ER 60 MG PO CP24: 60.0000 mg | ORAL_CAPSULE | Freq: Every day | ORAL | 0 refills | Status: DC

## 2018-09-06 MED ORDER — ROSUVASTATIN CALCIUM 5 MG PO TABS: 5.0000 mg | ORAL_TABLET | Freq: Every day | ORAL | 0 refills | Status: DC

## 2018-09-06 NOTE — Telephone Encounter (Signed)
Pt's medications were sent to pt's pharmacy as requested. Confirmation received.  

## 2018-09-06 NOTE — Telephone Encounter (Signed)
 *  STAT* If patient is at the pharmacy, call can be transferred to refill team.   1. Which medications need to be refilled? (please list name of each medication and dose if known)   propranolol ER (INDERAL LA) 60 MG 24 hr capsule rosuvastatin (CRESTOR) 5 MG tablet isosorbide mononitrate (IMDUR) 60 MG 24 hr tablet  2. Which pharmacy/location (including street and city if local pharmacy) is medication to be sent to? [facility] [place]  3. Do they need a 30 day or 90 day supply? [place]

## 2018-10-11 NOTE — Progress Notes (Signed)
Cardiology Office Note   Date:  10/12/2018   ID:  Cully, Luckow 1945-12-29, MRN [id number]  PCP:  Shirline Frees, MD  Cardiologist:  Dr. Marlou Porch    Chief Complaint  Patient presents with  . Coronary Artery Disease    bradycardia      History of Present Illness: [name] is a 73 y.o. male who presents for CAD.   He has a history of coronary artery disease status post PCI to the RCA in 2003. Last Myoview was in March 2011 which showed no ischemia. EF was 55-60% by echo in 2014 with mild MR. He is known to have an incomplete right bundle branch block with a QRS duration 90 ms range. He recently was presyncopal and was admitted for evaluation of this. He was noted to be bradycardic with heart rates in the upper 30s and had had imaging including CT angiogram and MRI. This showed mild bilateral carotid artery stenosis, not likely playing a role in his presyncope. He was on propranolol XR 60 mg which was discontinued and heart rate has subsequently increased up to 50 beats per minute prior to discharge.    08/14/17-has had a 40 pound overall weight loss.  Doing great.  Previously we had to stop the Inderal because of some bradycardia.  We are going to resume at low-dose 60 mg extended release to help him with his essential tremor which has worsened.  We are stopping the lisinopril 2.5 mg because of low normal blood pressures in the past.  Continuing with isosorbide. Mild chest discomfort possible related to hiatal hernia.   His inderal was resumed at half dose for tremor. Lisinopril stopped.  Today his heart rate is 46 but no symptoms of symptomatic bradycardia.  His tremor did improve with addition of inderal.  He is active.  Pt does snore but no times of apnea per his wife.  BP is stable.  No chest pain or SOB.  Past Medical History:  Diagnosis Date  . Arthritis   . Bronchitis   . Coronary artery disease    multiple PCIs. has had pinched diagonal with 80-90% ostial  stenosis,  ETT myoview (3/11): 8/03, no ST changes, hypertensive BP response, no evidence for ischemia or infarction, EF 66%     . GERD (gastroesophageal reflux disease)   . HTN (hypertension)    sees Dr. Samara Snide  . Hyperlipidemia    low HDL  . Myocardial infarction [facility]) 2003   sees Dr. Marigene Ehlers  . 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 Tremor     Past Surgical History:  Procedure Laterality Date  . COLONOSCOPY  10/25/2007   Dr.Stark  . CORONARY ANGIOPLASTY WITH STENT PLACEMENT  2003  . HERNIA REPAIR  04/22/99   umbilical hernia repair  . INSERTION OF MESH N/A 10/18/2012   Procedure: INSERTION OF MESH;  Surgeon: Ralene Ok, MD;  Location: [facility];  Service: General;  Laterality: N/A;  . UMBILICAL HERNIA REPAIR N/A 10/18/2012   Procedure: LAPAROSCOPIC UMBILICAL HERNIA;  Surgeon: Ralene Ok, MD;  Location: [facility];  Service: General;  Laterality: N/A;     Current Outpatient Medications  Medication Sig Dispense Refill  . acetaminophen (TYLENOL) 500 MG tablet Take 500-1,000 mg by mouth every 6 (six) hours as needed for pain.    Marland Kitchen aspirin EC 81 MG tablet Take 81 mg by mouth daily.    Marland Kitchen EPINEPHrine 0.3 mg/0.3 mL IJ SOAJ injection  Inject into the skin as needed.  1  . famotidine (PEPCID) 40 MG tablet Take 40 mg by mouth daily.     . isosorbide mononitrate (IMDUR) 60 MG 24 hr tablet Take 1 tablet (60 mg total) by mouth daily. Please keep upcoming appt in February for future refills. Thank you 90 tablet 0  . nitroGLYCERIN (NITROSTAT) 0.4 MG SL tablet Place 0.4 mg under the tongue every 5 (five) minutes as needed for chest pain.    Marland Kitchen propranolol ER (INDERAL LA) 60 MG 24 hr capsule Take 1 capsule (60 mg total) by mouth daily. Please keep upcoming appt in February for future refills. Thank you 90 capsule 0  . rosuvastatin (CRESTOR) 5 MG tablet Take 1 tablet (5 mg total) by mouth daily. Please keep upcoming appt in February for future refills.  Thank you 90 tablet 0   Current Facility-Administered Medications  Medication Dose Route Frequency Provider Last Rate Last Dose  . 0.9 %  sodium chloride infusion  500 mL Intravenous Once Ladene Artist, MD        Allergies:   Shellfish allergy; Clopidogrel bisulfate; and Red wine complex [germanium]    Social History:  The patient  reports that he quit smoking about 42 years ago. He has never used smokeless tobacco. He reports current alcohol use. He reports that he does not use drugs.   Family History:  The patient's family history includes Cancer in his paternal uncle; Coronary artery disease in an other family member; Diabetes in his mother; Kidney disease in his mother; Stroke in his mother.    ROS:  General:no colds or fevers, mild weight increase Skin:no rashes or ulcers HEENT:no blurred vision, no congestion CV:see HPI PUL:see HPI GI:no diarrhea constipation or melena, no indigestion GU:no hematuria, no dysuria MS:no joint pain, no claudication Neuro:no syncope, no lightheadedness Endo:no diabetes, no thyroid disease  Wt Readings from Last 3 Encounters:  10/12/18 172 lb 9.6 oz (78.3 kg)  04/21/18 164 lb (74.4 kg)  04/09/18 169 lb (76.7 kg)     PHYSICAL EXAM: VS:  BP 118/62   Pulse (!) 46   Ht 5\' 11"  (1.803 m)   Wt 172 lb 9.6 oz (78.3 kg)   SpO2 97%   BMI 24.07 kg/m  , BMI Body mass index is 24.07 kg/m. General:Pleasant affect, NAD Skin:Warm and dry, brisk capillary refill HEENT:normocephalic, sclera clear, mucus membranes moist Neck:supple, no JVD, no bruits  Heart:S1S2 RRR without murmur, gallup, rub or click  HR is slow Lungs:clear without rales, rhonchi, or wheezes LKT:GYBW, non tender, + BS, do not palpate liver spleen or masses Ext:no lower ext edema, 2+ pedal pulses, 2+ radial pulses Neuro:alert and oriented X 3, MAE, follows commands, + facial symmetry  With ambulation HR is without change.   EKG:  EKG is ordered today. The ekg ordered today  demonstrates Sinus brady at 46.no changes otherwise   Recent Labs: No results found for requested labs within last 8760 hours.    Lipid Panel    Component Value Date/Time   CHOL 112 05/11/2017 1035   TRIG 87 05/11/2017 1035   HDL 34 (L) 05/11/2017 1035   CHOLHDL 3.3 05/11/2017 1035   CHOLHDL 4 01/29/2015 0849   VLDL 18.8 01/29/2015 0849   LDLCALC 61 05/11/2017 1035       Other studies Reviewed: Additional studies/ records that were reviewed today include: . Echo 04/06/17 Study Conclusions  - Left ventricle: The cavity size was normal. Wall thickness was  increased in a pattern of mild LVH. Systolic function was normal.   The estimated ejection fraction was in the range of 55% to 60%.   Wall motion was normal; there were no regional wall motion   abnormalities. Doppler parameters are consistent with abnormal   left ventricular relaxation (grade 1 diastolic dysfunction). - Mitral valve: There was systolic anterior motion of the chordal   structures. There was mild regurgitation.  Impressions:  - Normal LV systolic function; mild diastolic dysfunction; mild   LVH; mild chordal SAM; mild MR.   ASSESSMENT AND PLAN:  1.  CAD stable without chest pain   2.  Bradycardia at 46- asymptomatic, HR does not increase with activity.  Discussed with Dr.Skains no changes in meds now, discussed symptoms to be aware of  And he and his wife will call is becomes symptomatic. Follow up in 6 months with Dr. Marlou Porch.  3.  HLD will check lipids and hepatic today.  4.  HTN stable no med changes  5. Essential tremor continue Inderal for now.  It is helping his tremor.     Current medicines are reviewed with the patient today.  The patient Has no concerns regarding medicines.  The following changes have been made:  See above Labs/ tests ordered today include:see above  Disposition:   FU:  see above  Signed, Cecilie Kicks, NP  10/12/2018 8:26 AM    [address], [place], [place], [place] Phone: [phone]; Fax: [phone]

## 2018-10-12 ENCOUNTER — Ambulatory Visit: Payer: Medicare Other | Admitting: Cardiology

## 2018-10-12 ENCOUNTER — Encounter: Payer: Self-pay | Admitting: Cardiology

## 2018-10-12 ENCOUNTER — Encounter (INDEPENDENT_AMBULATORY_CARE_PROVIDER_SITE_OTHER): Payer: Self-pay

## 2018-10-12 VITALS — BP 118/62 | HR 46 | Ht 71.0 in | Wt 172.6 lb

## 2018-10-12 DIAGNOSIS — I2510 Atherosclerotic heart disease of native coronary artery without angina pectoris: Secondary | ICD-10-CM

## 2018-10-12 DIAGNOSIS — I10 Essential (primary) hypertension: Secondary | ICD-10-CM

## 2018-10-12 DIAGNOSIS — R251 Tremor, unspecified: Secondary | ICD-10-CM

## 2018-10-12 DIAGNOSIS — R001 Bradycardia, unspecified: Secondary | ICD-10-CM | POA: Diagnosis not present

## 2018-10-12 DIAGNOSIS — E782 Mixed hyperlipidemia: Secondary | ICD-10-CM | POA: Diagnosis not present

## 2018-10-12 NOTE — Patient Instructions (Signed)
Medication Instructions:  Your physician recommends that you continue on your current medications as directed. Please refer to the Current Medication list given to you today.  If you need a refill on your cardiac medications before your next appointment, please call your pharmacy.   Lab work: TODAY: CMET, LIPIDS & TSH  If you have labs (blood work) drawn today and your tests are completely normal, you will receive your results only by: Marland Kitchen MyChart Message (if you have MyChart) OR . A paper copy in the mail If you have any lab test that is abnormal or we need to change your treatment, we will call you to review the results.  Testing/Procedures: None  Follow-Up: At [facility], you and your health needs are our priority.  As part of our continuing mission to provide you with exceptional heart care, we have created designated Provider Care Teams.  These Care Teams include your primary Cardiologist (physician) and Advanced Practice Providers (APPs -  Physician Assistants and Nurse Practitioners) who all work together to provide you with the care you need, when you need it. You will need a follow up appointment in 6 months.  Please call our office 2 months in advance to schedule this appointment.  You may see Dr. Marlou Porch or one of the following Advanced Practice Providers on your designated Care Team:   Truitt Merle, NP Cecilie Kicks, NP . Kathyrn Drown, NP  Any Other Special Instructions Will Be Listed Below (If Applicable).

## 2018-10-13 ENCOUNTER — Other Ambulatory Visit: Payer: Medicare Other | Admitting: *Deleted

## 2018-10-13 ENCOUNTER — Telehealth: Payer: Self-pay | Admitting: *Deleted

## 2018-10-13 DIAGNOSIS — Z79899 Other long term (current) drug therapy: Secondary | ICD-10-CM

## 2018-10-13 LAB — BASIC METABOLIC PANEL
BUN/Creatinine Ratio: 18 (ref 10–24)
BUN: 17 mg/dL (ref 8–27)
CHLORIDE: 105 mmol/L (ref 96–106)
CO2: 24 mmol/L (ref 20–29)
Calcium: 9.3 mg/dL (ref 8.6–10.2)
Creatinine, Ser: 0.92 mg/dL (ref 0.76–1.27)
GFR calc Af Amer: 96 mL/min/{1.73_m2} (ref >59)
GFR calc non Af Amer: 83 mL/min/{1.73_m2} (ref >59)
Glucose: 97 mg/dL (ref 65–99)
Potassium: 4.2 mmol/L (ref 3.5–5.2)
Sodium: 143 mmol/L (ref 134–144)

## 2018-10-13 LAB — COMPREHENSIVE METABOLIC PANEL
ALT: 14 IU/L (ref 0–44)
AST: 14 IU/L (ref 0–40)
Albumin/Globulin Ratio: 1.8 (ref 1.2–2.2)
Albumin: 4.2 g/dL (ref 3.7–4.7)
Alkaline Phosphatase: 67 IU/L (ref 39–117)
BUN/Creatinine Ratio: 16 (ref 10–24)
BUN: 15 mg/dL (ref 8–27)
Bilirubin Total: 1.3 mg/dL — ABNORMAL HIGH (ref 0.0–1.2)
CO2: 23 mmol/L (ref 20–29)
Calcium: 9.3 mg/dL (ref 8.6–10.2)
Chloride: 110 mmol/L — ABNORMAL HIGH (ref 96–106)
Creatinine, Ser: 0.91 mg/dL (ref 0.76–1.27)
GFR calc Af Amer: 98 mL/min/{1.73_m2} (ref >59)
GFR calc non Af Amer: 84 mL/min/{1.73_m2} (ref >59)
Globulin, Total: 2.4 g/dL (ref 1.5–4.5)
Glucose: 112 mg/dL — ABNORMAL HIGH (ref 65–99)
Potassium: 5.4 mmol/L — ABNORMAL HIGH (ref 3.5–5.2)
Sodium: 148 mmol/L — ABNORMAL HIGH (ref 134–144)
Total Protein: 6.6 g/dL (ref 6.0–8.5)

## 2018-10-13 LAB — LIPID PANEL
CHOL/HDL RATIO: 3.1 ratio (ref 0.0–5.0)
Cholesterol, Total: 121 mg/dL (ref 100–199)
HDL: 39 mg/dL — ABNORMAL LOW (ref >39)
LDL Calculated: 71 mg/dL (ref 0–99)
Triglycerides: 57 mg/dL (ref 0–149)
VLDL CHOLESTEROL CAL: 11 mg/dL (ref 5–40)

## 2018-10-13 LAB — TSH: TSH: 1.47 u[IU]/mL (ref 0.450–4.500)

## 2018-10-13 NOTE — Telephone Encounter (Signed)
[name], pts wife, DPR on file, returned my call. She has been made aware of pts lab results. She will have pt come back in today for repeat.

## 2018-10-13 NOTE — Telephone Encounter (Signed)
Tried to reach pt re: high potassium on lab results. Left a message for pt to call back. (If pt eating bananas or oranges, have him stop,. Drink extra water Repeat BMET today.)

## 2018-10-15 ENCOUNTER — Other Ambulatory Visit: Payer: Self-pay | Admitting: *Deleted

## 2018-10-15 MED ORDER — PROPRANOLOL HCL 40 MG PO TABS: 40.0000 mg | ORAL_TABLET | Freq: Every day | ORAL | 3 refills | Status: DC

## 2018-12-17 ENCOUNTER — Other Ambulatory Visit: Payer: Self-pay | Admitting: Cardiology

## 2019-03-03 ENCOUNTER — Other Ambulatory Visit: Payer: Self-pay | Admitting: Cardiology

## 2019-06-30 ENCOUNTER — Other Ambulatory Visit: Payer: Self-pay | Admitting: Cardiology

## 2019-07-17 ENCOUNTER — Other Ambulatory Visit: Payer: Self-pay | Admitting: Cardiology

## 2019-10-02 ENCOUNTER — Ambulatory Visit: Payer: Medicare Other

## 2019-10-08 ENCOUNTER — Other Ambulatory Visit: Payer: Self-pay | Admitting: Cardiology

## 2019-10-10 ENCOUNTER — Other Ambulatory Visit: Payer: Self-pay | Admitting: Cardiology

## 2019-10-21 ENCOUNTER — Ambulatory Visit: Payer: Medicare Other

## 2019-10-27 ENCOUNTER — Other Ambulatory Visit: Payer: Self-pay | Admitting: Cardiology

## 2019-11-05 ENCOUNTER — Other Ambulatory Visit: Payer: Self-pay | Admitting: Cardiology

## 2019-11-07 ENCOUNTER — Other Ambulatory Visit: Payer: Self-pay | Admitting: Cardiology

## 2019-11-09 ENCOUNTER — Other Ambulatory Visit: Payer: Self-pay | Admitting: Cardiology

## 2019-11-17 ENCOUNTER — Ambulatory Visit: Payer: Medicare PPO | Admitting: Cardiology

## 2019-11-17 ENCOUNTER — Other Ambulatory Visit: Payer: Self-pay

## 2019-11-17 ENCOUNTER — Encounter: Payer: Self-pay | Admitting: Cardiology

## 2019-11-17 VITALS — BP 110/70 | HR 49 | Resp 15 | Ht 72.0 in | Wt 176.0 lb

## 2019-11-17 DIAGNOSIS — E782 Mixed hyperlipidemia: Secondary | ICD-10-CM | POA: Diagnosis not present

## 2019-11-17 DIAGNOSIS — I2510 Atherosclerotic heart disease of native coronary artery without angina pectoris: Secondary | ICD-10-CM | POA: Diagnosis not present

## 2019-11-17 DIAGNOSIS — I10 Essential (primary) hypertension: Secondary | ICD-10-CM

## 2019-11-17 DIAGNOSIS — Z79899 Other long term (current) drug therapy: Secondary | ICD-10-CM | POA: Diagnosis not present

## 2019-11-17 DIAGNOSIS — I209 Angina pectoris, unspecified: Secondary | ICD-10-CM

## 2019-11-17 LAB — COMPREHENSIVE METABOLIC PANEL
ALT: 13 IU/L (ref 0–44)
AST: 14 IU/L (ref 0–40)
Albumin/Globulin Ratio: 1.7 (ref 1.2–2.2)
Albumin: 4.3 g/dL (ref 3.7–4.7)
Alkaline Phosphatase: 75 IU/L (ref 39–117)
BUN/Creatinine Ratio: 19 (ref 10–24)
BUN: 15 mg/dL (ref 8–27)
Bilirubin Total: 0.8 mg/dL (ref 0.0–1.2)
CO2: 23 mmol/L (ref 20–29)
Calcium: 9.4 mg/dL (ref 8.6–10.2)
Chloride: 106 mmol/L (ref 96–106)
Creatinine, Ser: 0.79 mg/dL (ref 0.76–1.27)
GFR calc Af Amer: 104 mL/min/{1.73_m2} (ref >59)
GFR calc non Af Amer: 90 mL/min/{1.73_m2} (ref >59)
Globulin, Total: 2.5 g/dL (ref 1.5–4.5)
Glucose: 115 mg/dL — ABNORMAL HIGH (ref 65–99)
Potassium: 4.4 mmol/L (ref 3.5–5.2)
Sodium: 143 mmol/L (ref 134–144)
Total Protein: 6.8 g/dL (ref 6.0–8.5)

## 2019-11-17 LAB — LIPID PANEL
Chol/HDL Ratio: 3.3 ratio (ref 0.0–5.0)
Cholesterol, Total: 127 mg/dL (ref 100–199)
HDL: 39 mg/dL — ABNORMAL LOW (ref >39)
LDL Chol Calc (NIH): 74 mg/dL (ref 0–99)
Triglycerides: 67 mg/dL (ref 0–149)
VLDL Cholesterol Cal: 14 mg/dL (ref 5–40)

## 2019-11-17 LAB — CBC
Hematocrit: 44.6 % (ref 37.5–51.0)
Hemoglobin: 15.2 g/dL (ref 13.0–17.7)
MCH: 32.7 pg (ref 26.6–33.0)
MCHC: 34.1 g/dL (ref 31.5–35.7)
MCV: 96 fL (ref 79–97)
Platelets: 183 10*3/uL (ref 150–450)
RBC: 4.65 x10E6/uL (ref 4.14–5.80)
RDW: 11.9 % (ref 11.6–15.4)
WBC: 8 10*3/uL (ref 3.4–10.8)

## 2019-11-17 NOTE — Patient Instructions (Signed)
Medication Instructions:  Please discontinue your propranolol.  Continue all other medications as listed.  *If you need a refill on your cardiac medications before your next appointment, please call your pharmacy*   Lab Work: Please have blood work today (CBC, CMP, Lipid)  If you have labs (blood work) drawn today and your tests are completely normal, you will receive your results only by: Marland Kitchen MyChart Message (if you have MyChart) OR . A paper copy in the mail If you have any lab test that is abnormal or we need to change your treatment, we will call you to review the results.  Follow-Up: At [facility], you and your health needs are our priority.  As part of our continuing mission to provide you with exceptional heart care, we have created designated Provider Care Teams.  These Care Teams include your primary Cardiologist (physician) and Advanced Practice Providers (APPs -  Physician Assistants and Nurse Practitioners) who all work together to provide you with the care you need, when you need it.  We recommend signing up for the patient portal called "MyChart".  Sign up information is provided on this After Visit Summary.  MyChart is used to connect with patients for Virtual Visits (Telemedicine).  Patients are able to view lab/test results, encounter notes, upcoming appointments, etc.  Non-urgent messages can be sent to your provider as well.   To learn more about what you can do with MyChart, go to [email or web address].    Your next appointment:   6 month(s)  The format for your next appointment:   In Person  Provider:   Cecilie Kicks, NP  Thank you for choosing [facility]!!

## 2019-11-17 NOTE — Progress Notes (Signed)
Cardiology Office Note:    Date:  11/17/2019   ID:  [name], [name] [birth year], MRN [id number]  PCP:  Tyler Frees, MD  Cardiologist:  No primary care provider on file.  Electrophysiologist:  None   Referring MD: Tyler Frees, MD     History of Present Illness:    [name] is a 74 y.o. male of CAD status post PCI to the RCA in 2003.  Myoview in 2011 no ischemia.  Echo then 60% in 2014 with mild MR.  EKG previously shows incomplete left bundle branch block with 90 ms duration of QRS.  He has mild bilateral carotid artery stenosis.  An episode of presyncope occurred propranolol XR 60 mg was discontinued because of heart rates in the upper 30s.  Heart rate increased to 50 at discharge.  This propranolol was restarted half dose because of tremor.  Heart rate in the 40s.  Tremor did not really improve with the addition of propranolol again.  Since he has significant bradycardia we decided to stop on 11/17/2019.  Good overall weight loss.  Lisinopril 2.5 mg was stopped because of low normal blood pressure in the past.  He has had some mild chest discomfort, continuing with isosorbide, occasional thoughts of hiatal hernia causing this.  Past Medical History:  Diagnosis Date  . Arthritis   . Bronchitis   . Coronary artery disease    multiple PCIs. has had pinched diagonal with 80-90% ostial stenosis,  ETT myoview (3/11): 8/03, no ST changes, hypertensive BP response, no evidence for ischemia or infarction, EF 66%     . GERD (gastroesophageal reflux disease)   . HTN (hypertension)    sees Dr. Samara [name]  . Hyperlipidemia    low HDL  . Myocardial infarction [facility]) 2003   sees Dr. Marigene [name]  . Neuromuscular disorder ([place])    essential tremor- pt. reports its genetic  . Tremor     Past Surgical History:  Procedure Laterality Date  . COLONOSCOPY  10/25/2007   Dr.Stark  . CORONARY ANGIOPLASTY WITH STENT PLACEMENT  2003  . HERNIA REPAIR  [id number]   umbilical hernia  repair  . INSERTION OF MESH N/A 10/18/2012   Procedure: INSERTION OF MESH;  Surgeon: Tyler Ok, MD;  Location: [place];  Service: General;  Laterality: N/A;  . UMBILICAL HERNIA REPAIR N/A 10/18/2012   Procedure: LAPAROSCOPIC UMBILICAL HERNIA;  Surgeon: [place] Ok, MD;  Location: [place];  Service: General;  Laterality: N/A;    Current Medications: Current Meds  Medication Sig  . acetaminophen (TYLENOL) 500 MG tablet Take 500-1,000 mg by mouth every 6 (six) hours as needed for pain.  Marland Kitchen aspirin EC 81 MG tablet Take 81 mg by mouth daily.  Marland Kitchen EPINEPHrine 0.3 mg/0.3 mL IJ SOAJ injection Inject into the skin as needed.  . famotidine (PEPCID) 40 MG tablet Take 40 mg by mouth daily.   . isosorbide mononitrate (IMDUR) 60 MG 24 hr tablet Take 1 tablet (60 mg total) by mouth daily. Please keep upcoming appt in March with Dr. Marlou [name] before anymore refills. Thank you  . nitroGLYCERIN (NITROSTAT) 0.4 MG SL tablet Place 0.4 mg under the tongue every 5 (five) minutes as needed for chest pain.  . rosuvastatin (CRESTOR) 5 MG tablet TAKE 1 TABLET BY MOUTH DAILY  . [DISCONTINUED] propranolol (INDERAL) 40 MG tablet Take 1 tablet (40 mg total) by mouth daily.     Allergies:   Shellfish allergy, Clopidogrel bisulfate, and Red wine complex [germanium]  Social History   Socioeconomic History  . Marital status: Married    Spouse name: Not on file  . Number of children: Not on file  . Years of education: Not on file  . Highest education level: Not on file  Occupational History  . Not on file  Tobacco Use  . Smoking status: Former Smoker    Quit date: 12/24/1975    Years since quitting: 43.9  . Smokeless tobacco: Never Used  Substance and Sexual Activity  . Alcohol use: Yes    Comment: very seldom  . Drug use: No  . Sexual activity: Not on file  Other Topics Concern  . Not on file  Social History Narrative   Current disabled   Social Determinants of Health   Financial Resource Strain:   .  Difficulty of Paying Living Expenses:   Food Insecurity:   . Worried About Charity fundraiser in the Last Year:   . Arboriculturist in the Last Year:   Transportation Needs:   . Film/video editor (Medical):   Marland Kitchen Lack of Transportation (Non-Medical):   Physical Activity:   . Days of Exercise per Week:   . Minutes of Exercise per Session:   Stress:   . Feeling of Stress :   Social Connections:   . Frequency of Communication with Friends and Family:   . Frequency of Social Gatherings with Friends and Family:   . Attends Religious Services:   . Active Member of Clubs or Organizations:   . Attends Archivist Meetings:   Marland Kitchen Marital Status:      Family History: The patient's family history includes Cancer in his paternal uncle; Coronary artery disease in an other family member; Diabetes in his mother; Kidney disease in his mother; Stroke in his mother. There is no history of Colon cancer, Esophageal cancer, Rectal cancer, or Stomach cancer.  ROS:   Please see the history of present illness.     All other systems reviewed and are negative.  EKGs/Labs/Other Studies Reviewed:    The following studies were reviewed today: Echo EF 60% mild mitral regurgitation  EKG:  EKG is  ordered today.  The ekg ordered today demonstrates sinus bradycardia 49 with incomplete right bundle branch block QRS duration 100 ms  Recent Labs: No results found for requested labs within last 8760 hours.  Recent Lipid Panel    Component Value Date/Time   CHOL 121 10/12/2018 0918   TRIG 57 10/12/2018 0918   HDL 39 (L) 10/12/2018 0918   CHOLHDL 3.1 10/12/2018 0918   CHOLHDL 4 01/29/2015 0849   VLDL 18.8 01/29/2015 0849   LDLCALC 71 10/12/2018 0918    Physical Exam:    VS:  BP 110/70   Pulse (!) 49   Resp 15   Ht 6' (1.829 m)   Wt 176 lb (79.8 kg)   SpO2 100%   BMI 23.87 kg/m     Wt Readings from Last 3 Encounters:  11/17/19 176 lb (79.8 kg)  10/12/18 172 lb 9.6 oz (78.3 kg)    04/21/18 164 lb (74.4 kg)     GEN:  Well nourished, well developed in no acute distress HEENT: Normal NECK: No JVD; No carotid bruits LYMPHATICS: No lymphadenopathy CARDIAC: RRR, no murmurs, rubs, gallops RESPIRATORY:  Clear to auscultation without rales, wheezing or rhonchi  ABDOMEN: Soft, non-tender, non-distended MUSCULOSKELETAL:  No edema; No deformity  SKIN: Warm and dry NEUROLOGIC:  Alert and oriented x 3, tremor noted  PSYCHIATRIC:  Normal affect   ASSESSMENT:    1. Atherosclerosis of native coronary artery of native heart without angina pectoris   2. Essential hypertension   3. Medication management   4. Mixed hyperlipidemia   5. Angina pectoris (HCC)    PLAN:    In order of problems listed above:  Coronary artery disease -Prior PCI RCA stable no anginal symptoms on isosorbide. ASA  Bradycardia -Previously had stopped his propranolol.  This was resumed at half dose because of tremor.  Essential hypertension -Stable no changes  Hyperlipidemia -Lipid panel, LDL 71 in February 2020 ALT 14 creatinine 0.9 hemoglobin 14.5 1 year ago.  We will go ahead and check labs today.  Essential tremor -Low-dose Inderal in place but he is still having tremors.  I think with his prior episode of significant bradycardia, dizziness after looking up at the park, I would like to go ahead and stop the beta-blocker propranolol.  Low back pain -He will address with Dr. Kenton Kingfisher.  6 months Mickel Baas, 13 me.  Lets keep an eye on his heart rate.   Medication Adjustments/Labs and Tests Ordered: Current medicines are reviewed at length with the patient today.  Concerns regarding medicines are outlined above.  Orders Placed This Encounter  Procedures  . CBC  . Comprehensive metabolic panel  . Lipid panel  . EKG 12-Lead   No orders of the defined types were placed in this encounter.   Patient Instructions  Medication Instructions:  Please discontinue your propranolol.  Continue all  other medications as listed.  *If you need a refill on your cardiac medications before your next appointment, please call your pharmacy*   Lab Work: Please have blood work today (CBC, CMP, Lipid)  If you have labs (blood work) drawn today and your tests are completely normal, you will receive your results only by: Marland Kitchen MyChart Message (if you have MyChart) OR . A paper copy in the mail If you have any lab test that is abnormal or we need to change your treatment, we will call you to review the results.  Follow-Up: At [facility], you and your health needs are our priority.  As part of our continuing mission to provide you with exceptional heart care, we have created designated Provider Care Teams.  These Care Teams include your primary Cardiologist (physician) and Advanced Practice Providers (APPs -  Physician Assistants and Nurse Practitioners) who all work together to provide you with the care you need, when you need it.  We recommend signing up for the patient portal called "MyChart".  Sign up information is provided on this After Visit Summary.  MyChart is used to connect with patients for Virtual Visits (Telemedicine).  Patients are able to view lab/test results, encounter notes, upcoming appointments, etc.  Non-urgent messages can be sent to your provider as well.   To learn more about what you can do with MyChart, go to [email or web address].    Your next appointment:   6 month(s)  The format for your next appointment:   In Person  Provider:   Cecilie Kicks, NP  Thank you for choosing [facility]!!        Signed, Candee Furbish, MD  11/17/2019 8:50 AM    [place]

## 2019-11-18 ENCOUNTER — Other Ambulatory Visit: Payer: Self-pay | Admitting: *Deleted

## 2019-11-18 MED ORDER — ROSUVASTATIN CALCIUM 10 MG PO TABS: 10.0000 mg | ORAL_TABLET | Freq: Every day | ORAL | 3 refills | Status: DC

## 2019-11-18 NOTE — Progress Notes (Signed)
RX for Crestor 10 sent to [facility] as requested.

## 2019-12-10 ENCOUNTER — Other Ambulatory Visit: Payer: Self-pay | Admitting: Cardiology

## 2020-03-22 DIAGNOSIS — H524 Presbyopia: Secondary | ICD-10-CM | POA: Diagnosis not present

## 2020-03-22 DIAGNOSIS — H25013 Cortical age-related cataract, bilateral: Secondary | ICD-10-CM | POA: Diagnosis not present

## 2020-03-22 DIAGNOSIS — H35013 Changes in retinal vascular appearance, bilateral: Secondary | ICD-10-CM | POA: Diagnosis not present

## 2020-03-22 DIAGNOSIS — H2513 Age-related nuclear cataract, bilateral: Secondary | ICD-10-CM | POA: Diagnosis not present

## 2020-05-08 NOTE — Progress Notes (Signed)
Cardiology Office Note   Date:  05/10/2020   ID:  Jeremiyah, Cullens [birth year], MRN [id number]  PCP:  Shirline Frees, MD  Cardiologist:  Dr. Marlou Porch     Chief Complaint  Patient presents with  . Coronary Artery Disease      History of Present Illness: [name] is a 74 y.o. male who presents for CAD   CAD status post PCI to the RCA in 2003.  Myoview in 2011 no ischemia.  Echo then 60% in 2014 with mild MR.  EKG previously shows incomplete left bundle branch block with 90 ms duration of QRS.  He has mild bilateral carotid artery stenosis.  An episode of presyncope occurred propranolol XR 60 mg was discontinued because of heart rates in the upper 30s.  Heart rate increased to 50 at discharge.  This propranolol was restarted half dose because of tremor.  Heart rate in the 40s.  Tremor did not really improve with the addition of propranolol again.  Since he has significant bradycardia we decided to stop on 11/17/2019.  Lisinopril 2.5 mg was stopped because of low normal blood pressure in the past.    Today he is feeling well.  No chest pain or SOB. Very active, building a greenhouse.  We had increased his Crestor so will need recheck.  No lightheadedness no dizziness.  He is now on primidone at 51 he is improved with his essential tremor.   The propranolol was stopped due to syncope and bradycardia.    we briefly discussed PPM in future that may or may not be needed. Aware of signs and symptoms.     Past Medical History:  Diagnosis Date  . Arthritis   . Bronchitis   . Coronary artery disease    multiple PCIs. has had pinched diagonal with 80-90% ostial stenosis,  ETT myoview (3/11): 8/03, no ST changes, hypertensive BP response, no evidence for ischemia or infarction, EF 66%     . GERD (gastroesophageal reflux disease)   . HTN (hypertension)    sees Dr. Samara Snide  . Hyperlipidemia    low HDL  . Myocardial infarction [facility]) 2003   sees Dr. Marigene Ehlers  .  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 Tremor     Past Surgical History:  Procedure Laterality Date  . COLONOSCOPY  10/25/2007   Dr.Stark  . CORONARY ANGIOPLASTY WITH STENT PLACEMENT  2003  . HERNIA REPAIR  5/62/13   umbilical hernia repair  . INSERTION OF MESH N/A 10/18/2012   Procedure: INSERTION OF MESH;  Surgeon: Ralene Ok, MD;  Location: [place];  Service: General;  Laterality: N/A;  . UMBILICAL HERNIA REPAIR N/A 10/18/2012   Procedure: LAPAROSCOPIC UMBILICAL HERNIA;  Surgeon: Ralene Ok, MD;  Location: [place];  Service: General;  Laterality: N/A;     Current Outpatient Medications  Medication Sig Dispense Refill  . acetaminophen (TYLENOL) 500 MG tablet Take 500-1,000 mg by mouth every 6 (six) hours as needed for pain.    Marland Kitchen aspirin EC 81 MG tablet Take 81 mg by mouth daily.    Marland Kitchen EPINEPHrine 0.3 mg/0.3 mL IJ SOAJ injection Inject into the skin as needed.  1  . famotidine (PEPCID) 40 MG tablet Take 40 mg by mouth daily.     . isosorbide mononitrate (IMDUR) 60 MG 24 hr tablet TAKE 1 TABLET BY MOUTH DAILY 90 tablet 3  . nitroGLYCERIN (NITROSTAT) 0.4 MG SL tablet Place 0.4 mg  under the tongue every 5 (five) minutes as needed for chest pain.    . primidone (MYSOLINE) 50 MG tablet Take 50 mg by mouth at bedtime.    . rosuvastatin (CRESTOR) 10 MG tablet Take 1 tablet (10 mg total) by mouth at bedtime. 90 tablet 3   No current facility-administered medications for this visit.    Allergies:   Shellfish allergy, Clopidogrel bisulfate, and Red wine complex [germanium]    Social History:  The patient  reports that he quit smoking about 44 years ago. He has never used smokeless tobacco. He reports current alcohol use. He reports that he does not use drugs.   Family History:  The patient's family history includes Cancer in his paternal uncle; Coronary artery disease in an other family member; Diabetes in his mother; Kidney disease in his mother;  Stroke in his mother.    ROS:  General:no colds or fevers, no weight changes Skin:no rashes or ulcers HEENT:no blurred vision, no congestion CV:see HPI PUL:see HPI GI:no diarrhea constipation or melena, no indigestion GU:no hematuria, no dysuria MS:no joint pain, no claudication Neuro:no syncope, no lightheadedness, Rt hand tremor. Endo:no diabetes, no thyroid disease  Wt Readings from Last 3 Encounters:  05/10/20 178 lb 12.8 oz (81.1 kg)  11/17/19 176 lb (79.8 kg)  10/12/18 172 lb 9.6 oz (78.3 kg)     PHYSICAL EXAM: VS:  BP (!) 100/58   Pulse (!) 52   Ht 6' (1.829 m)   Wt 178 lb 12.8 oz (81.1 kg)   SpO2 98%   BMI 24.25 kg/m  , BMI Body mass index is 24.25 kg/m. General:Pleasant affect, NAD Skin:Warm and dry, brisk capillary refill HEENT:normocephalic, sclera clear, mucus membranes moist Neck:supple, no JVD, no bruits  Heart:S1S2 RRR without murmur, gallup, rub or click Lungs:clear without rales, rhonchi, or wheezes ZSW:FUXN, non tender, + BS, do not palpate liver spleen or masses Ext:no lower ext edema, 2+ pedal pulses, 2+ radial pulses Neuro:alert and oriented, MAE, follows commands, + facial symmetry    EKG:  EKG is NOT  ordered today.   Recent Labs: 11/17/2019: ALT 13; BUN 15; Creatinine, Ser 0.79; Hemoglobin 15.2; Platelets 183; Potassium 4.4; Sodium 143    Lipid Panel    Component Value Date/Time   CHOL 127 11/17/2019 0852   TRIG 67 11/17/2019 0852   HDL 39 (L) 11/17/2019 0852   CHOLHDL 3.3 11/17/2019 0852   CHOLHDL 4 01/29/2015 0849   VLDL 18.8 01/29/2015 0849   LDLCALC 74 11/17/2019 0852       Other studies Reviewed: Additional studies/ records that were reviewed today include: . ECHO 04/06/17 Study Conclusions   - Left ventricle: The cavity size was normal. Wall thickness was  increased in a pattern of mild LVH. Systolic function was normal.  The estimated ejection fraction was in the range of 55% to 60%.  Wall motion was normal;  there were no regional wall motion  abnormalities. Doppler parameters are consistent with abnormal  left ventricular relaxation (grade 1 diastolic dysfunction).  - Mitral valve: There was systolic anterior motion of the chordal  structures. There was mild regurgitation.   Impressions:   - Normal LV systolic function; mild diastolic dysfunction; mild  LVH; mild chordal SAM; mild MR.   Monitor  04/10/17   Normal sinus rhythm  No pauses  No atrial fibrillation  Avg. HR 67 bpm. Normal      ASSESSMENT AND PLAN:  1.  CAD with hx PCI RCA, no angina on imdur  and asa  2.  HLD has had increase of crestor will check lipids and CMP today need to send results to PCP  3.  Bradycardia 52 HR today no symptoms.  Pt aware to call for symptoms no rate slowing meds  4.  Essential tremor improved on primidone.  Will follow up in 6 months with Dr. Marlou Porch.    Current medicines are reviewed with the patient today.  The patient Has no concerns regarding medicines.  The following changes have been made:  See above Labs/ tests ordered today include:see above  Disposition:   FU:  see above  Signed, Cecilie Kicks, NP  05/10/2020 8:13 AM    [facility], [place], [address] [place], [place] Phone: [phone]; Fax: [phone]

## 2020-05-10 ENCOUNTER — Ambulatory Visit: Payer: Medicare PPO | Admitting: Cardiology

## 2020-05-10 ENCOUNTER — Encounter: Payer: Self-pay | Admitting: Cardiology

## 2020-05-10 ENCOUNTER — Other Ambulatory Visit: Payer: Self-pay

## 2020-05-10 VITALS — BP 100/58 | HR 52 | Ht 72.0 in | Wt 178.8 lb

## 2020-05-10 DIAGNOSIS — I2510 Atherosclerotic heart disease of native coronary artery without angina pectoris: Secondary | ICD-10-CM | POA: Diagnosis not present

## 2020-05-10 DIAGNOSIS — Z79899 Other long term (current) drug therapy: Secondary | ICD-10-CM | POA: Diagnosis not present

## 2020-05-10 DIAGNOSIS — I10 Essential (primary) hypertension: Secondary | ICD-10-CM | POA: Diagnosis not present

## 2020-05-10 DIAGNOSIS — E782 Mixed hyperlipidemia: Secondary | ICD-10-CM | POA: Diagnosis not present

## 2020-05-10 DIAGNOSIS — R251 Tremor, unspecified: Secondary | ICD-10-CM | POA: Diagnosis not present

## 2020-05-10 LAB — COMPREHENSIVE METABOLIC PANEL
ALT: 16 IU/L (ref 0–44)
AST: 11 IU/L (ref 0–40)
Albumin/Globulin Ratio: 1.7 (ref 1.2–2.2)
Albumin: 4.3 g/dL (ref 3.7–4.7)
Alkaline Phosphatase: 77 IU/L (ref 44–121)
BUN/Creatinine Ratio: 19 (ref 10–24)
BUN: 16 mg/dL (ref 8–27)
Bilirubin Total: 1.1 mg/dL (ref 0.0–1.2)
CO2: 23 mmol/L (ref 20–29)
Calcium: 9.4 mg/dL (ref 8.6–10.2)
Chloride: 104 mmol/L (ref 96–106)
Creatinine, Ser: 0.84 mg/dL (ref 0.76–1.27)
GFR calc Af Amer: 100 mL/min/{1.73_m2} (ref >59)
GFR calc non Af Amer: 87 mL/min/{1.73_m2} (ref >59)
Globulin, Total: 2.5 g/dL (ref 1.5–4.5)
Glucose: 99 mg/dL (ref 65–99)
Potassium: 4.7 mmol/L (ref 3.5–5.2)
Sodium: 140 mmol/L (ref 134–144)
Total Protein: 6.8 g/dL (ref 6.0–8.5)

## 2020-05-10 LAB — LIPID PANEL
Chol/HDL Ratio: 3 ratio (ref 0.0–5.0)
Cholesterol, Total: 127 mg/dL (ref 100–199)
HDL: 42 mg/dL (ref >39)
LDL Chol Calc (NIH): 70 mg/dL (ref 0–99)
Triglycerides: 77 mg/dL (ref 0–149)
VLDL Cholesterol Cal: 15 mg/dL (ref 5–40)

## 2020-05-10 NOTE — Patient Instructions (Signed)
.  Medication Instructions:  Your physician recommends that you continue on your current medications as directed. Please refer to the Current Medication list given to you today.  *If you need a refill on your cardiac medications before your next appointment, please call your pharmacy*   Lab Work: TODAY:  LIPID & CMP  If you have labs (blood work) drawn today and your tests are completely normal, you will receive your results only by:  [place] (if you have MyChart) OR  A paper copy in the mail If you have any lab test that is abnormal or we need to change your treatment, we will call you to review the results.   Testing/Procedures: None ordered   Follow-Up: At [facility], you and your health needs are our priority.  As part of our continuing mission to provide you with exceptional heart care, we have created designated Provider Care Teams.  These Care Teams include your primary Cardiologist (physician) and Advanced Practice Providers (APPs -  Physician Assistants and Nurse Practitioners) who all work together to provide you with the care you need, when you need it.  We recommend signing up for the patient portal called "MyChart".  Sign up information is provided on this After Visit Summary.  MyChart is used to connect with patients for Virtual Visits (Telemedicine).  Patients are able to view lab/test results, encounter notes, upcoming appointments, etc.  Non-urgent messages can be sent to your provider as well.   To learn more about what you can do with MyChart, go to [email or web address].    Your next appointment:   6 month(s)  The format for your next appointment:   In Person  Provider:   You may see No primary care provider on file. or one of the following Advanced Practice Providers on your designated Care Team:    Truitt Merle, NP  Cecilie Kicks, NP  Kathyrn Drown, NP    Other Instructions

## 2020-05-14 DIAGNOSIS — R251 Tremor, unspecified: Secondary | ICD-10-CM | POA: Diagnosis not present

## 2020-05-14 DIAGNOSIS — I2510 Atherosclerotic heart disease of native coronary artery without angina pectoris: Secondary | ICD-10-CM | POA: Diagnosis not present

## 2020-05-14 DIAGNOSIS — R739 Hyperglycemia, unspecified: Secondary | ICD-10-CM | POA: Diagnosis not present

## 2020-05-14 DIAGNOSIS — R7309 Other abnormal glucose: Secondary | ICD-10-CM | POA: Diagnosis not present

## 2020-05-14 DIAGNOSIS — Z0000 Encounter for general adult medical examination without abnormal findings: Secondary | ICD-10-CM | POA: Diagnosis not present

## 2020-05-14 DIAGNOSIS — Z125 Encounter for screening for malignant neoplasm of prostate: Secondary | ICD-10-CM | POA: Diagnosis not present

## 2020-05-14 DIAGNOSIS — Z23 Encounter for immunization: Secondary | ICD-10-CM | POA: Diagnosis not present

## 2020-05-14 DIAGNOSIS — I10 Essential (primary) hypertension: Secondary | ICD-10-CM | POA: Diagnosis not present

## 2020-05-14 DIAGNOSIS — E7800 Pure hypercholesterolemia, unspecified: Secondary | ICD-10-CM | POA: Diagnosis not present

## 2020-05-24 ENCOUNTER — Ambulatory Visit: Payer: Medicare PPO

## 2020-05-28 ENCOUNTER — Ambulatory Visit: Payer: Medicare PPO | Attending: Internal Medicine

## 2020-05-28 DIAGNOSIS — Z23 Encounter for immunization: Secondary | ICD-10-CM

## 2020-05-28 NOTE — Progress Notes (Signed)
   Covid-19 Vaccination Clinic  Name:  LAKODA MCANANY    MRN: [id number] DOB: 12/12/1945  05/28/2020  Mr. Roosevelt was observed post Covid-19 immunization for 15 minutes without incident. He was provided with Vaccine Information Sheet and instruction to access the V-Safe system.   Mr. Morimoto was instructed to call 911 with any severe reactions post vaccine: Marland Kitchen Difficulty breathing  . Swelling of face and throat  . A fast heartbeat  . A bad rash all over body  . Dizziness and weakness

## 2020-08-25 HISTORY — PX: CATARACT EXTRACTION, BILATERAL: SHX1313

## 2020-08-29 DIAGNOSIS — Z20822 Contact with and (suspected) exposure to covid-19: Secondary | ICD-10-CM | POA: Diagnosis not present

## 2020-10-18 DIAGNOSIS — T781XXD Other adverse food reactions, not elsewhere classified, subsequent encounter: Secondary | ICD-10-CM | POA: Diagnosis not present

## 2020-10-18 DIAGNOSIS — J309 Allergic rhinitis, unspecified: Secondary | ICD-10-CM | POA: Diagnosis not present

## 2020-10-18 DIAGNOSIS — Z91013 Allergy to seafood: Secondary | ICD-10-CM | POA: Diagnosis not present

## 2020-11-20 ENCOUNTER — Ambulatory Visit: Payer: Medicare PPO | Admitting: Cardiology

## 2020-11-20 ENCOUNTER — Encounter: Payer: Self-pay | Admitting: Cardiology

## 2020-11-20 ENCOUNTER — Other Ambulatory Visit: Payer: Self-pay

## 2020-11-20 VITALS — BP 124/70 | HR 51 | Ht 72.0 in | Wt 181.0 lb

## 2020-11-20 DIAGNOSIS — E782 Mixed hyperlipidemia: Secondary | ICD-10-CM | POA: Diagnosis not present

## 2020-11-20 DIAGNOSIS — I209 Angina pectoris, unspecified: Secondary | ICD-10-CM

## 2020-11-20 DIAGNOSIS — I2510 Atherosclerotic heart disease of native coronary artery without angina pectoris: Secondary | ICD-10-CM

## 2020-11-20 DIAGNOSIS — R251 Tremor, unspecified: Secondary | ICD-10-CM | POA: Diagnosis not present

## 2020-11-20 NOTE — Progress Notes (Signed)
Cardiology Office Note:    Date:  11/20/2020   ID:  Wister, Hoefle [birth year], MRN [id number]  PCP:  Shirline Frees, [place]  Cardiologist:  Candee Furbish, MD  Advanced Practice Provider:  No care team member to display Electrophysiologist:  None       Referring MD: Shirline Frees, MD     History of Present Illness:    [name] is a 75 y.o. male here for follow-up of coronary artery disease.  Catheterization-2003-RCA PCI. Myoview 2011-no ischemia Echocardiogram 2014-EF 60% with mild MR Echocardiogram 2018 showed EF 60% mild LVH grade 1 diastolic dysfunction mild mitral regurgitation with mild Sam. Event monitor 2018 -Normal sinus rhythm no pauses no atrial fibrillation heart rate 67 on average   Mild bilateral carotid artery stenosis  Prior bradycardia were propranolol XR 60 was discontinued because of heart rates in the upper 30s.  Propranolol was used at half dose because of tremor.  Hypotension noted in the past-lisinopril 2.5 was stopped because of this.  Has hiatal hernia.  Quite active, still building a greenhouse.  Crestor for hyperlipidemia, LDL 70.  Now on primidone for essential tremor.  About to go on a trip to [place] and [place] for a few weeks.    Past Medical History:  Diagnosis Date  . Arthritis   . Bronchitis   . Coronary artery disease    multiple PCIs. has had pinched diagonal with 80-90% ostial stenosis,  ETT myoview (3/11): 8/03, no ST changes, hypertensive BP response, no evidence for ischemia or infarction, EF 66%     . GERD (gastroesophageal reflux disease)   . HTN (hypertension)    sees Dr. Samara Snide  . Hyperlipidemia    low HDL  . Myocardial infarction [facility]) 2003   sees Dr. Marigene Ehlers  . Neuromuscular disorder ([place])    essential tremor- pt. reports its genetic  . Tremor     Past Surgical History:  Procedure Laterality Date  . COLONOSCOPY  10/25/2007   Dr.Stark  . CORONARY ANGIOPLASTY  WITH STENT PLACEMENT  2003  . HERNIA REPAIR  8/54/62   umbilical hernia repair  . INSERTION OF MESH N/A 10/18/2012   Procedure: INSERTION OF MESH;  Surgeon: Ralene Ok, MD;  Location: [place];  Service: General;  Laterality: N/A;  . UMBILICAL HERNIA REPAIR N/A 10/18/2012   Procedure: LAPAROSCOPIC UMBILICAL HERNIA;  Surgeon: Ralene Ok, MD;  Location: [place];  Service: General;  Laterality: N/A;    Current Medications: Current Meds  Medication Sig  . acetaminophen (TYLENOL) 500 MG tablet Take 500-1,000 mg by mouth every 6 (six) hours as needed for pain.  Marland Kitchen aspirin EC 81 MG tablet Take 81 mg by mouth daily.  Marland Kitchen EPINEPHrine 0.3 mg/0.3 mL IJ SOAJ injection Inject into the skin as needed.  . famotidine (PEPCID) 40 MG tablet Take 40 mg by mouth daily.   . isosorbide mononitrate (IMDUR) 60 MG 24 hr tablet TAKE 1 TABLET BY MOUTH DAILY  . nitroGLYCERIN (NITROSTAT) 0.4 MG SL tablet Place 0.4 mg under the tongue every 5 (five) minutes as needed for chest pain.  . primidone (MYSOLINE) 50 MG tablet Take 50 mg by mouth at bedtime.  . rosuvastatin (CRESTOR) 10 MG tablet Take 1 tablet (10 mg total) by mouth at bedtime.     Allergies:   Shellfish allergy, Clopidogrel bisulfate, and Red wine complex [germanium]   Social History   Socioeconomic History  . Marital status: Married  Spouse name: Not on file  . Number of children: Not on file  . Years of education: Not on file  . Highest education level: Not on file  Occupational History  . Not on file  Tobacco Use  . Smoking status: Former Smoker    Quit date: 12/24/1975    Years since quitting: 44.9  . Smokeless tobacco: Never Used  Vaping Use  . Vaping Use: Never used  Substance and Sexual Activity  . Alcohol use: Yes    Comment: very seldom  . Drug use: No  . Sexual activity: Not on file  Other Topics Concern  . Not on file  Social History Narrative   Current disabled   Social Determinants of Health   Financial Resource Strain:  Not on file  Food Insecurity: Not on file  Transportation Needs: Not on file  Physical Activity: Not on file  Stress: Not on file  Social Connections: Not on file     Family History: The patient's family history includes Cancer in his paternal uncle; Coronary artery disease in an other family member; Diabetes in his mother; Kidney disease in his mother; Stroke in his mother. There is no history of Colon cancer, Esophageal cancer, Rectal cancer, or Stomach cancer.  ROS:   Please see the history of present illness.     All other systems reviewed and are negative.  EKGs/Labs/Other Studies Reviewed:    The following studies were reviewed today:   EKG:  EKG is  ordered today.  The ekg ordered today demonstrates sinus bradycardia 51 with incomplete right bundle branch block, no significant change from prior  Recent Labs: 05/10/2020: ALT 16; BUN 16; Creatinine, Ser 0.84; Potassium 4.7; Sodium 140  Recent Lipid Panel    Component Value Date/Time   CHOL 127 05/10/2020 0831   TRIG 77 05/10/2020 0831   HDL 42 05/10/2020 0831   CHOLHDL 3.0 05/10/2020 0831   CHOLHDL 4 01/29/2015 0849   VLDL 18.8 01/29/2015 0849   LDLCALC 70 05/10/2020 0831     Risk Assessment/Calculations:      Physical Exam:    VS:  BP 124/70 (BP Location: Left Arm, Patient Position: Sitting, Cuff Size: Normal)   Pulse (!) 51   Ht 6' (1.829 m)   Wt 181 lb (82.1 kg)   SpO2 96%   BMI 24.55 kg/m     Wt Readings from Last 3 Encounters:  11/20/20 181 lb (82.1 kg)  05/10/20 178 lb 12.8 oz (81.1 kg)  11/17/19 176 lb (79.8 kg)     GEN:  Well nourished, well developed in no acute distress HEENT: Normal NECK: No JVD; No carotid bruits LYMPHATICS: No lymphadenopathy CARDIAC: brady reg, no murmurs, rubs, gallops RESPIRATORY:  Clear to auscultation without rales, wheezing or rhonchi  ABDOMEN: Soft, non-tender, non-distended MUSCULOSKELETAL:  No edema; No deformity  SKIN: Warm and dry NEUROLOGIC:  Alert and  oriented x 3 PSYCHIATRIC:  Normal affect   ASSESSMENT:    1. Atherosclerosis of native coronary artery of native heart without angina pectoris   2. Tremor   3. Angina pectoris ([place])   4. Mixed hyperlipidemia    PLAN:    In order of problems listed above:  Coronary artery disease -RCA PCI 2003 stable with no symptoms of angina on isosorbide aspirin other goal-directed medical therapy.  Bradycardia -Stopped propranolol in the past.  Was on half dose because of tremor.  Stable with heart rate 51.  Essential hypertension -Doing well, well controlled.  In fact had  hypotension, and had to stop lisinopril at 1 point.  Doing very well.  No changes.  Hyperlipidemia -Prior lipid panel showed LDL 70, creatinine 0.9 hemoglobin 14.5.  Essential tremor Low-dose propranolol in the past.  Now on primidone.  Doing well.  1 yr  Medication Adjustments/Labs and Tests Ordered: Current medicines are reviewed at length with the patient today.  Concerns regarding medicines are outlined above.  Orders Placed This Encounter  Procedures  . EKG 12-Lead   No orders of the defined types were placed in this encounter.   Patient Instructions  Medication Instructions:  The current medical regimen is effective;  continue present plan and medications.  *If you need a refill on your cardiac medications before your next appointment, please call your pharmacy*  Follow-Up: At [facility], you and your health needs are our priority.  As part of our continuing mission to provide you with exceptional heart care, we have created designated Provider Care Teams.  These Care Teams include your primary Cardiologist (physician) and Advanced Practice Providers (APPs -  Physician Assistants and Nurse Practitioners) who all work together to provide you with the care you need, when you need it.  We recommend signing up for the patient portal called "MyChart".  Sign up information is provided on this After Visit  Summary.  MyChart is used to connect with patients for Virtual Visits (Telemedicine).  Patients are able to view lab/test results, encounter notes, upcoming appointments, etc.  Non-urgent messages can be sent to your provider as well.   To learn more about what you can do with MyChart, go to [email or web address].    Your next appointment:   12 month(s)  The format for your next appointment:   In Person  Provider:   Candee Furbish, MD   Thank you for choosing [facility]!!    Safe [place]!!     Signed, Candee Furbish, MD  11/20/2020 8:58 AM    [place]

## 2020-11-20 NOTE — Patient Instructions (Addendum)
Medication Instructions:  The current medical regimen is effective;  continue present plan and medications.  *If you need a refill on your cardiac medications before your next appointment, please call your pharmacy*  Follow-Up: At [facility], you and your health needs are our priority.  As part of our continuing mission to provide you with exceptional heart care, we have created designated Provider Care Teams.  These Care Teams include your primary Cardiologist (physician) and Advanced Practice Providers (APPs -  Physician Assistants and Nurse Practitioners) who all work together to provide you with the care you need, when you need it.  We recommend signing up for the patient portal called "MyChart".  Sign up information is provided on this After Visit Summary.  MyChart is used to connect with patients for Virtual Visits (Telemedicine).  Patients are able to view lab/test results, encounter notes, upcoming appointments, etc.  Non-urgent messages can be sent to your provider as well.   To learn more about what you can do with MyChart, go to [email or web address].    Your next appointment:   12 month(s)  The format for your next appointment:   In Person  Provider:   Candee Furbish, MD   Thank you for choosing [place]!!    Safe [place]!!

## 2020-12-03 DIAGNOSIS — Z20822 Contact with and (suspected) exposure to covid-19: Secondary | ICD-10-CM | POA: Diagnosis not present

## 2020-12-04 DIAGNOSIS — Z20822 Contact with and (suspected) exposure to covid-19: Secondary | ICD-10-CM | POA: Diagnosis not present

## 2020-12-04 DIAGNOSIS — Z03818 Encounter for observation for suspected exposure to other biological agents ruled out: Secondary | ICD-10-CM | POA: Diagnosis not present

## 2020-12-18 ENCOUNTER — Other Ambulatory Visit: Payer: Self-pay | Admitting: Cardiology

## 2020-12-29 ENCOUNTER — Other Ambulatory Visit: Payer: Self-pay | Admitting: Cardiology

## 2021-04-22 DIAGNOSIS — H25013 Cortical age-related cataract, bilateral: Secondary | ICD-10-CM | POA: Diagnosis not present

## 2021-04-22 DIAGNOSIS — H2512 Age-related nuclear cataract, left eye: Secondary | ICD-10-CM | POA: Diagnosis not present

## 2021-04-22 DIAGNOSIS — H35033 Hypertensive retinopathy, bilateral: Secondary | ICD-10-CM | POA: Diagnosis not present

## 2021-04-22 DIAGNOSIS — H524 Presbyopia: Secondary | ICD-10-CM | POA: Diagnosis not present

## 2021-04-22 DIAGNOSIS — H2513 Age-related nuclear cataract, bilateral: Secondary | ICD-10-CM | POA: Diagnosis not present

## 2021-04-22 DIAGNOSIS — H35013 Changes in retinal vascular appearance, bilateral: Secondary | ICD-10-CM | POA: Diagnosis not present

## 2021-05-09 DIAGNOSIS — Z0000 Encounter for general adult medical examination without abnormal findings: Secondary | ICD-10-CM | POA: Diagnosis not present

## 2021-05-09 DIAGNOSIS — R251 Tremor, unspecified: Secondary | ICD-10-CM | POA: Diagnosis not present

## 2021-05-09 DIAGNOSIS — Z125 Encounter for screening for malignant neoplasm of prostate: Secondary | ICD-10-CM | POA: Diagnosis not present

## 2021-05-09 DIAGNOSIS — E7800 Pure hypercholesterolemia, unspecified: Secondary | ICD-10-CM | POA: Diagnosis not present

## 2021-05-09 DIAGNOSIS — Z23 Encounter for immunization: Secondary | ICD-10-CM | POA: Diagnosis not present

## 2021-05-09 DIAGNOSIS — K219 Gastro-esophageal reflux disease without esophagitis: Secondary | ICD-10-CM | POA: Diagnosis not present

## 2021-05-09 DIAGNOSIS — I10 Essential (primary) hypertension: Secondary | ICD-10-CM | POA: Diagnosis not present

## 2021-05-09 DIAGNOSIS — I2510 Atherosclerotic heart disease of native coronary artery without angina pectoris: Secondary | ICD-10-CM | POA: Diagnosis not present

## 2021-05-14 DIAGNOSIS — H2512 Age-related nuclear cataract, left eye: Secondary | ICD-10-CM | POA: Diagnosis not present

## 2021-05-14 DIAGNOSIS — H25812 Combined forms of age-related cataract, left eye: Secondary | ICD-10-CM | POA: Diagnosis not present

## 2021-06-17 DIAGNOSIS — H2511 Age-related nuclear cataract, right eye: Secondary | ICD-10-CM | POA: Diagnosis not present

## 2021-06-17 DIAGNOSIS — H25011 Cortical age-related cataract, right eye: Secondary | ICD-10-CM | POA: Diagnosis not present

## 2021-06-19 ENCOUNTER — Emergency Department (HOSPITAL_COMMUNITY)
Admission: EM | Admit: 2021-06-19 | Discharge: 2021-06-20 | Disposition: A | Discharge status: Home / Self Care | Payer: Medicare PPO | Attending: Emergency Medicine | Admitting: Emergency Medicine

## 2021-06-19 ENCOUNTER — Emergency Department (HOSPITAL_COMMUNITY): Payer: Medicare PPO

## 2021-06-19 ENCOUNTER — Other Ambulatory Visit: Payer: Self-pay

## 2021-06-19 DIAGNOSIS — Z5321 Procedure and treatment not carried out due to patient leaving prior to being seen by health care provider: Secondary | ICD-10-CM | POA: Diagnosis not present

## 2021-06-19 DIAGNOSIS — R7309 Other abnormal glucose: Secondary | ICD-10-CM | POA: Insufficient documentation

## 2021-06-19 DIAGNOSIS — R4182 Altered mental status, unspecified: Secondary | ICD-10-CM | POA: Diagnosis not present

## 2021-06-19 DIAGNOSIS — R9431 Abnormal electrocardiogram [ECG] [EKG]: Secondary | ICD-10-CM | POA: Diagnosis not present

## 2021-06-19 DIAGNOSIS — R410 Disorientation, unspecified: Secondary | ICD-10-CM | POA: Diagnosis not present

## 2021-06-19 DIAGNOSIS — H538 Other visual disturbances: Secondary | ICD-10-CM | POA: Diagnosis not present

## 2021-06-19 DIAGNOSIS — R42 Dizziness and giddiness: Secondary | ICD-10-CM | POA: Diagnosis not present

## 2021-06-19 LAB — COMPREHENSIVE METABOLIC PANEL
ALT: 20 U/L (ref 0–44)
AST: 22 U/L (ref 15–41)
Albumin: 3.9 g/dL (ref 3.5–5.0)
Alkaline Phosphatase: 56 U/L (ref 38–126)
Anion gap: 4 — ABNORMAL LOW (ref 5–15)
BUN: 14 mg/dL (ref 8–23)
CO2: 29 mmol/L (ref 22–32)
Calcium: 9.3 mg/dL (ref 8.9–10.3)
Chloride: 107 mmol/L (ref 98–111)
Creatinine, Ser: 1.02 mg/dL (ref 0.61–1.24)
GFR, Estimated: >60 mL/min (ref >60)
Glucose, Bld: 98 mg/dL (ref 70–99)
Potassium: 3.8 mmol/L (ref 3.5–5.1)
Sodium: 140 mmol/L (ref 135–145)
Total Bilirubin: 0.9 mg/dL (ref 0.3–1.2)
Total Protein: 7 g/dL (ref 6.5–8.1)

## 2021-06-19 LAB — URINALYSIS, ROUTINE W REFLEX MICROSCOPIC
Bacteria, UA: NONE SEEN
Bilirubin Urine: NEGATIVE
Glucose, UA: NEGATIVE mg/dL
Ketones, ur: NEGATIVE mg/dL
Leukocytes,Ua: NEGATIVE
Nitrite: NEGATIVE
Protein, ur: NEGATIVE mg/dL
Specific Gravity, Urine: 1.029 (ref 1.005–1.030)
pH: 5 (ref 5.0–8.0)

## 2021-06-19 LAB — CBC
HCT: 44.7 % (ref 39.0–52.0)
Hemoglobin: 15 g/dL (ref 13.0–17.0)
MCH: 31.9 pg (ref 26.0–34.0)
MCHC: 33.6 g/dL (ref 30.0–36.0)
MCV: 95.1 fL (ref 80.0–100.0)
Platelets: 181 10*3/uL (ref 150–400)
RBC: 4.7 MIL/uL (ref 4.22–5.81)
RDW: 12.4 % (ref 11.5–15.5)
WBC: 8.8 10*3/uL (ref 4.0–10.5)
nRBC: 0 % (ref 0.0–0.2)

## 2021-06-19 LAB — CBG MONITORING, ED: Glucose-Capillary: 93 mg/dL (ref 70–99)

## 2021-06-19 NOTE — ED Provider Notes (Signed)
Emergency Medicine Provider Triage Evaluation Note  [name] , a 75 y.o. male  was evaluated in triage.  Patient brought in by wife for increased confusion and difficulties with short-term memory for the past 6 to 8 weeks.  Patient recently had cataract surgery, and ophthalmology recommended evaluation for stroke on Monday due to vision changes.  Ophthalmology was concerned about a "clot" in his left eye, but stated this potentially was resolving.  Review of Systems  Positive: Confusion, vision change Negative: Fevers, chills, chest pain, shortness of breath, abdominal pain  Physical Exam  BP (!) 123/100   Pulse 70   Temp 98 F (36.7 C) (Oral)   Resp 18   Ht 6' (1.829 m)   Wt 82.1 kg   SpO2 99%   BMI 24.55 kg/m  Gen:   Awake, no distress   Resp:  Normal effort  MSK:   Moves extremities without difficulty  Other:  A&O x 4, 5/5 strength in all extremities, sensation intact, cranial nerve exam grossly normal, EOMI, PERRLA  Medical Decision Making  Medically screening exam initiated at 7:47 PM.  Appropriate orders placed.  [name] was informed that the remainder of the evaluation will be completed by another provider, this initial triage assessment does not replace that evaluation, and the importance of remaining in the ED until their evaluation is complete.     Estill Cotta 06/19/21 1949    Drenda Freeze, MD 06/19/21 [phone]

## 2021-06-19 NOTE — ED Triage Notes (Signed)
Pt BIB Wife [name]. States pt has had increased confusion (short term memory) for the past 4 weeks. Per note pt has cataract surgery prior to developing confusion. PCP recommended evaluation for stroke on Monday due to vision changes. Pt A&Ox4.

## 2021-06-20 NOTE — ED Notes (Signed)
Patient left on own accord. Given Patient Experience phone number.

## 2021-06-24 DIAGNOSIS — F419 Anxiety disorder, unspecified: Secondary | ICD-10-CM | POA: Diagnosis not present

## 2021-06-24 DIAGNOSIS — R2689 Other abnormalities of gait and mobility: Secondary | ICD-10-CM | POA: Diagnosis not present

## 2021-06-24 DIAGNOSIS — R413 Other amnesia: Secondary | ICD-10-CM | POA: Diagnosis not present

## 2021-06-24 DIAGNOSIS — R251 Tremor, unspecified: Secondary | ICD-10-CM | POA: Diagnosis not present

## 2021-06-24 DIAGNOSIS — I2510 Atherosclerotic heart disease of native coronary artery without angina pectoris: Secondary | ICD-10-CM | POA: Diagnosis not present

## 2021-06-25 DIAGNOSIS — H2511 Age-related nuclear cataract, right eye: Secondary | ICD-10-CM | POA: Diagnosis not present

## 2021-06-25 DIAGNOSIS — H25811 Combined forms of age-related cataract, right eye: Secondary | ICD-10-CM | POA: Diagnosis not present

## 2021-06-25 DIAGNOSIS — H25011 Cortical age-related cataract, right eye: Secondary | ICD-10-CM | POA: Diagnosis not present

## 2021-08-05 ENCOUNTER — Encounter: Payer: Self-pay | Admitting: *Deleted

## 2021-08-07 ENCOUNTER — Other Ambulatory Visit: Payer: Self-pay

## 2021-08-07 ENCOUNTER — Encounter: Payer: Self-pay | Admitting: Diagnostic Neuroimaging

## 2021-08-07 ENCOUNTER — Ambulatory Visit: Payer: Medicare PPO | Admitting: Diagnostic Neuroimaging

## 2021-08-07 VITALS — BP 114/71 | HR 60 | Ht 72.0 in | Wt 186.2 lb

## 2021-08-07 DIAGNOSIS — G20 Parkinson's disease: Secondary | ICD-10-CM | POA: Diagnosis not present

## 2021-08-07 DIAGNOSIS — G250 Essential tremor: Secondary | ICD-10-CM

## 2021-08-07 MED ORDER — PRIMIDONE 50 MG PO TABS: 50.0000 mg | ORAL_TABLET | Freq: Two times a day (BID) | ORAL | 5 refills | Status: DC

## 2021-08-07 NOTE — Patient Instructions (Signed)
°  ESSENTIAL TREMOR - increase primidone 50mg  twice a day  RESTING TREMOR, BRADYKINESIA, COGWHEELING RIGIDITY, SHUFFLING GAIT - could be new onset of parkinsonism - check DATscan

## 2021-08-07 NOTE — Progress Notes (Signed)
[facility]  PATIENT: [name] DOB: 02-26-1946  REFERRING CLINICIAN: Shirline Frees, MD HISTORY FROM: patient  REASON FOR VISIT: new consult    HISTORICAL  CHIEF COMPLAINT:  Chief Complaint  Patient presents with   Tremors, shuffling gait    Rm 7 New Pt, wife- [name] "tremor in hands all my life, my dad had it, Primidone has helped some; my feet hurt, may be neuropathy; eye dr said she saw evidence of burst blood vessel in eye- maybe mini stroke but went away on recheck"     HISTORY OF PRESENT ILLNESS:   75 year old male here for evaluation of tremors.  Patient has had long history of postural tremors since childhood.  There is family history of tremor in patient's father and patient's son.  He has been diagnosed with essential tremor and tried on primidone for the last few months without relief (primidone 50 mg at bedtime).   In last year patient also having some issues with shuffling gait and resting tremor according to wife.   REVIEW OF SYSTEMS: Full 14 system review of systems performed and negative with exception of: as per HPI   ALLERGIES: Allergies  Allergen Reactions   Shellfish Allergy Nausea And Vomiting    Diaphoresis, weak, extreme vomiting    Clopidogrel Bisulfate Hives    REACTION: rash   Red Wine Complex [Germanium] Other (See Comments)    unspecified    HOME MEDICATIONS: Outpatient Medications Prior to Visit  Medication Sig Dispense Refill   acetaminophen (TYLENOL) 500 MG tablet Take 500-1,000 mg by mouth every 6 (six) hours as needed for pain.     aspirin EC 81 MG tablet Take 81 mg by mouth daily.     EPINEPHrine 0.3 mg/0.3 mL IJ SOAJ injection Inject into the skin as needed.  1   famotidine (PEPCID) 40 MG tablet Take 40 mg by mouth daily.      isosorbide mononitrate (IMDUR) 60 MG 24 hr tablet TAKE 1 TABLET BY MOUTH DAILY 90 tablet 3   nitroGLYCERIN (NITROSTAT) 0.4 MG SL tablet Place 0.4 mg under the tongue every 5 (five)  minutes as needed for chest pain.     rosuvastatin (CRESTOR) 10 MG tablet TAKE 1 TABLET(10 MG) BY MOUTH AT BEDTIME 90 tablet 3   primidone (MYSOLINE) 50 MG tablet Take 50 mg by mouth at bedtime.     No facility-administered medications prior to visit.    PAST MEDICAL HISTORY: Past Medical History:  Diagnosis Date   Arthritis    Bronchitis    Coronary artery disease    multiple PCIs. has had pinched diagonal with 80-90% ostial stenosis,  ETT myoview (3/11): 8/03, no ST changes, hypertensive BP response, no evidence for ischemia or infarction, EF 66%      GERD (gastroesophageal reflux disease)    HTN (hypertension)    sees Dr. Samara Snide   Hyperlipidemia    low HDL   Myocardial infarction [facility]) 2003   sees Dr. Marigene Ehlers   Neuromuscular disorder [facility])    essential tremor- pt. reports its genetic   Tremor     PAST SURGICAL HISTORY: Past Surgical History:  Procedure Laterality Date   CATARACT EXTRACTION, BILATERAL Bilateral 2022   COLONOSCOPY  10/25/2007   Dr.Stark   CORONARY ANGIOPLASTY WITH STENT PLACEMENT  2003   HERNIA REPAIR  69/67/8938   umbilical hernia repair   INSERTION OF MESH N/A 10/18/2012   Procedure: INSERTION OF MESH;  Surgeon: Ralene Ok, MD;  Location: [place];  Service: General;  Laterality: N/A;   UMBILICAL HERNIA REPAIR N/A 10/18/2012   Procedure: LAPAROSCOPIC UMBILICAL HERNIA;  Surgeon: Ralene Ok, MD;  Location: [place];  Service: General;  Laterality: N/A;    FAMILY HISTORY: Family History  Problem Relation Age of Onset   Stroke Mother    Kidney disease Mother    Diabetes Mother    Tremor Father    Cancer Paternal Uncle        larynx   Coronary artery disease Other        family history   Colon cancer Neg Hx    Esophageal cancer Neg Hx    Rectal cancer Neg Hx    Stomach cancer Neg Hx     SOCIAL HISTORY: Social History   Socioeconomic History   Marital status: Married    Spouse name: [name]   Number of children: 1   Years of  education: Not on file   Highest education level: 11th grade  Occupational History   Not on file  Tobacco Use   Smoking status: Former    Types: Cigarettes    Quit date: 12/24/1975    Years since quitting: 45.6   Smokeless tobacco: Never  Vaping Use   Vaping Use: Never used  Substance and Sexual Activity   Alcohol use: Yes    Comment: very seldom   Drug use: No   Sexual activity: Not on file  Other Topics Concern   Not on file  Social History Narrative   Lives with wife   Current disabled   Social Determinants of Health   Financial Resource Strain: Not on file  Food Insecurity: Not on file  Transportation Needs: Not on file  Physical Activity: Not on file  Stress: Not on file  Social Connections: Not on file  Intimate Partner Violence: Not on file     PHYSICAL EXAM  GENERAL EXAM/CONSTITUTIONAL: Vitals:  Vitals:   08/07/21 1126  BP: 114/71  Pulse: 60  Weight: 186 lb 3.2 oz (84.5 kg)  Height: 6' (1.829 m)   Body mass index is 25.25 kg/m. Wt Readings from Last 3 Encounters:  08/07/21 186 lb 3.2 oz (84.5 kg)  06/19/21 181 lb (82.1 kg)  11/20/20 181 lb (82.1 kg)   Patient is in no distress; well developed, nourished and groomed; neck is supple  CARDIOVASCULAR: Examination of carotid arteries is normal; no carotid bruits Regular rate and rhythm, no murmurs Examination of peripheral vascular system by observation and palpation is normal  EYES: Ophthalmoscopic exam of optic discs and posterior segments is normal; no papilledema or hemorrhages No results found.  MUSCULOSKELETAL: Gait, strength, tone, movements noted in Neurologic exam below  NEUROLOGIC: MENTAL STATUS:  No flowsheet data found. awake, alert, oriented to person, place and time recent and remote memory intact normal attention and concentration language fluent, comprehension intact, naming intact fund of knowledge appropriate  CRANIAL NERVE:  2nd - no papilledema on fundoscopic exam 2nd,  3rd, 4th, 6th - pupils equal and reactive to light, visual fields full to confrontation, extraocular muscles intact, no nystagmus 5th - facial sensation symmetric 7th - facial strength symmetric 8th - hearing intact 9th - palate elevates symmetrically, uvula midline 11th - shoulder shrug symmetric 12th - tongue protrusion midline  MOTOR:  normal bulk and tone, full strength in the BUE, BLE POSTURAL TREMOR IN BUE RARE RESTING TREMOR IN BUE MOD COGWEELING IN LUE; MOD BRADYKINESIA IN LUE > RUE  SENSORY:  normal and symmetric to light touch, temperature, vibration  COORDINATION:  finger-nose-finger, fine finger movements normal  REFLEXES:  deep tendon reflexes 1+ and symmetric  GAIT/STATION:  narrow based gait; DECR ARM SWING; STOOPED POSTURE     DIAGNOSTIC DATA (LABS, IMAGING, TESTING) - I reviewed patient records, labs, notes, testing and imaging myself where available.  Lab Results  Component Value Date   WBC 8.8 06/19/2021   HGB 15.0 06/19/2021   HCT 44.7 06/19/2021   MCV 95.1 06/19/2021   PLT 181 06/19/2021      Component Value Date/Time   NA 140 06/19/2021 2007   NA 140 05/10/2020 0831   K 3.8 06/19/2021 2007   CL 107 06/19/2021 2007   CO2 29 06/19/2021 2007   GLUCOSE 98 06/19/2021 2007   BUN 14 06/19/2021 2007   BUN 16 05/10/2020 0831   CREATININE 1.02 06/19/2021 2007   CALCIUM 9.3 06/19/2021 2007   PROT 7.0 06/19/2021 2007   PROT 6.8 05/10/2020 0831   ALBUMIN 3.9 06/19/2021 2007   ALBUMIN 4.3 05/10/2020 0831   AST 22 06/19/2021 2007   ALT 20 06/19/2021 2007   ALKPHOS 56 06/19/2021 2007   BILITOT 0.9 06/19/2021 2007   BILITOT 1.1 05/10/2020 0831   GFRNONAA >60 06/19/2021 2007   GFRAA 100 05/10/2020 0831   Lab Results  Component Value Date   CHOL 127 05/10/2020   HDL 42 05/10/2020   LDLCALC 70 05/10/2020   TRIG 77 05/10/2020   CHOLHDL 3.0 05/10/2020   No results found for: HGBA1C No results found for: VITAMINB12 Lab Results  Component Value  Date   TSH 1.470 10/12/2018    06/19/21 MRI brain [I reviewed images myself and agree with interpretation. -VRP]  1. No acute intracranial pathology. 2. Global parenchymal volume loss and chronic white matter microangiopathy.    ASSESSMENT AND PLAN  75 y.o. year old male here with longstanding postural tremor consistent with essential/familial tremor.  Has had some change in symptoms starting in 2021, suspicious for parkinsonism.   Dx:  1. Essential tremor   2. Parkinsonism, unspecified Parkinsonism type ([place])       PLAN:  ESSENTIAL TREMOR - increase primidone 50mg  twice a day  RESTING TREMOR, BRADYKINESIA, COGWHEELING RIGIDITY, SHUFFLING GAIT - could be new onset of parkinsonism - consider DATscan vs monitoring  Orders Placed This Encounter  Procedures   NM BRAIN DATSCAN TUMOR LOC INFLAM SPECT 1 DAY    Meds ordered this encounter  Medications   primidone (MYSOLINE) 50 MG tablet    Sig: Take 1 tablet (50 mg total) by mouth in the morning and at bedtime.    Dispense:  60 tablet    Refill:  5    Return in about 8 months (around 04/07/2022).    Penni Bombard, MD 07/37/1062, 69:48 PM Certified in Neurology, Neurophysiology and Neuroimaging  [facility] [address], [place], [place] [address] [phone]

## 2021-08-14 ENCOUNTER — Telehealth: Payer: Self-pay | Admitting: Diagnostic Neuroimaging

## 2021-08-14 NOTE — Telephone Encounter (Signed)
[employer] no auth req spoke to [facility] Ref # [id number]. Faxed it to [facility], they will reach out to the patient to schedule.

## 2021-08-21 NOTE — Telephone Encounter (Signed)
Shanon Brow with [facility] has left a voicemail for the patient to call back.

## 2021-09-02 ENCOUNTER — Encounter: Payer: Self-pay | Admitting: Diagnostic Neuroimaging

## 2021-09-03 ENCOUNTER — Encounter (HOSPITAL_COMMUNITY)
Admission: RE | Admit: 2021-09-03 | Discharge: 2021-09-03 | Disposition: A | Discharge status: Home / Self Care | Payer: Medicare PPO | Source: Ambulatory Visit | Attending: Diagnostic Neuroimaging | Admitting: Diagnostic Neuroimaging

## 2021-09-03 ENCOUNTER — Other Ambulatory Visit: Payer: Self-pay

## 2021-09-03 DIAGNOSIS — G20C Parkinsonism, unspecified: Secondary | ICD-10-CM

## 2021-09-03 DIAGNOSIS — G20 Parkinson's disease: Secondary | ICD-10-CM | POA: Insufficient documentation

## 2021-09-03 DIAGNOSIS — G250 Essential tremor: Secondary | ICD-10-CM

## 2021-09-03 MED ORDER — POTASSIUM IODIDE (ANTIDOTE) 130 MG PO TABS
ORAL_TABLET | ORAL | Status: AC
  Filled 2021-09-03: qty 1

## 2021-09-03 MED ORDER — IOFLUPANE I 123 185 MBQ/2.5ML IV SOLN
4.3000 | Freq: Once | INTRAVENOUS | Status: AC | PRN
  Administered 2021-09-03: 4.3 via INTRAVENOUS
  Filled 2021-09-03: qty 5

## 2021-10-15 DIAGNOSIS — J309 Allergic rhinitis, unspecified: Secondary | ICD-10-CM | POA: Diagnosis not present

## 2021-10-15 DIAGNOSIS — T781XXD Other adverse food reactions, not elsewhere classified, subsequent encounter: Secondary | ICD-10-CM | POA: Diagnosis not present

## 2021-10-15 DIAGNOSIS — Z91013 Allergy to seafood: Secondary | ICD-10-CM | POA: Diagnosis not present

## 2021-11-06 DIAGNOSIS — I2510 Atherosclerotic heart disease of native coronary artery without angina pectoris: Secondary | ICD-10-CM | POA: Diagnosis not present

## 2021-11-06 DIAGNOSIS — R413 Other amnesia: Secondary | ICD-10-CM | POA: Diagnosis not present

## 2021-11-06 DIAGNOSIS — G20 Parkinson's disease: Secondary | ICD-10-CM | POA: Diagnosis not present

## 2021-11-06 DIAGNOSIS — R251 Tremor, unspecified: Secondary | ICD-10-CM | POA: Diagnosis not present

## 2021-11-06 DIAGNOSIS — I10 Essential (primary) hypertension: Secondary | ICD-10-CM | POA: Diagnosis not present

## 2021-11-06 DIAGNOSIS — K219 Gastro-esophageal reflux disease without esophagitis: Secondary | ICD-10-CM | POA: Diagnosis not present

## 2021-11-06 DIAGNOSIS — E7800 Pure hypercholesterolemia, unspecified: Secondary | ICD-10-CM | POA: Diagnosis not present

## 2021-11-27 NOTE — Progress Notes (Signed)
?Cardiology Office Note:   ? ?Date:  11/29/2021  ? ?ID:  [name], DOB September 28, 1945, MRN [id number] ? ?PCP:  Shirline Frees, MD ?  ?[facility] Providers ?Cardiologist:  Candee Furbish, MD    ? ?Referring MD: Shirline Frees, MD  ? ?Chief Complaint: Annual follow-up CAD, hyperlipidemia ? ?History of Present Illness:   ? ?[name] is a 76 y.o. male with a hx of CAD s/p PCI RCA 2003, hypertension, RBBB, chronic HFpEF, hyperlipidemia, mild bilateral carotid artery stenosis, bradycardia, essential tremor, and hiatal hernia.  ? ?He had PCI to his RCA in 2003.  Myoview in 2011 showed no ischemia.  Echocardiogram in 2014 showed EF 60% with mild MR.  Echocardiogram in 2018 showed EF 60%, mild LVH, grade 1 diastolic dysfunction, mild MR with mild SAM.  Event monitor 2018 showed normal sinus rhythm with no pauses, no atrial fibrillation, average heart rate 67 bpm.  Took propranolol for many years for tremor but it was discontinued in the setting of bradycardia with HRs in the upper 30s. Tremor is followed by neurology.  ? ?He was last seen in our office on 11/20/2020 by Dr. Marlou Porch at which time no changes were made to his medical therapy and one year follow-up was recommended.  ? ?Today, he is here alone for annual follow-up.  He reports he is feeling well and continues to be active at home currently repairing his front porch. Reports that he eats a heart healthy, low-sodium diet and hydrates well with water. He denies chest pain, shortness of breath, lower extremity edema, fatigue, palpitations, melena, hematuria, hemoptysis, diaphoresis, weakness, presyncope, syncope, orthopnea, and PND. He has no specific complaints.  ? ?Past Medical History:  ?Diagnosis Date  ? Arthritis   ? Bronchitis   ? Coronary artery disease   ? multiple PCIs. has had pinched diagonal with 80-90% ostial stenosis,  ETT myoview (3/11): 8/03, no ST changes, hypertensive BP response, no evidence for ischemia or infarction, EF 66%     ? GERD  (gastroesophageal reflux disease)   ? HTN (hypertension)   ? sees Dr. Samara Snide  ? Hyperlipidemia   ? low HDL  ? Myocardial infarction [facility]) 2003  ? sees Dr. Marigene Ehlers  ? Neuromuscular disorder ([place])   ? 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 Tremor   ? ? ?Past Surgical History:  ?Procedure Laterality Date  ? CATARACT EXTRACTION, BILATERAL Bilateral 2022  ? COLONOSCOPY  10/25/2007  ? Dr.Stark  ? CORONARY ANGIOPLASTY WITH STENT PLACEMENT  2003  ? HERNIA REPAIR  09/73/5329  ? umbilical hernia repair  ? INSERTION OF MESH N/A 10/18/2012  ? Procedure: INSERTION OF MESH;  Surgeon: Ralene Ok, MD;  Location: [place];  Service: General;  Laterality: N/A;  ? UMBILICAL HERNIA REPAIR N/A 10/18/2012  ? Procedure: LAPAROSCOPIC UMBILICAL HERNIA;  Surgeon: Ralene Ok, MD;  Location: [place];  Service: General;  Laterality: N/A;  ? ? ?Current Medications: ?Current Meds  ?Medication Sig  ? acetaminophen (TYLENOL) 500 MG tablet Take 500-1,000 mg by mouth every 6 (six) hours as needed for pain.  ? aspirin EC 81 MG tablet Take 81 mg by mouth daily.  ? EPINEPHrine 0.3 mg/0.3 mL IJ SOAJ injection Inject into the skin as needed.  ? famotidine (PEPCID) 40 MG tablet Take 40 mg by mouth daily.   ? isosorbide mononitrate (IMDUR) 60 MG 24 hr tablet TAKE 1 TABLET BY MOUTH DAILY  ? nitroGLYCERIN (NITROSTAT) 0.4 MG SL tablet Place 0.4 mg under  the tongue every 5 (five) minutes as needed for chest pain.  ? primidone (MYSOLINE) 50 MG tablet Take 1 tablet (50 mg total) by mouth in the morning and at bedtime.  ? rosuvastatin (CRESTOR) 10 MG tablet TAKE 1 TABLET(10 MG) BY MOUTH AT BEDTIME  ?  ? ?Allergies:   Shellfish allergy, Clopidogrel bisulfate, and Red wine complex [germanium]  ? ?Social History  ? ?Socioeconomic History  ? Marital status: Married  ?  Spouse name: [name]  ? Number of children: 1  ? Years of education: Not on file  ? Highest education level: 11th grade  ?Occupational History  ? Not on file  ?Tobacco Use  ? Smoking  status: Former  ?  Types: Cigarettes  ?  Quit date: 12/24/1975  ?  Years since quitting: 45.9  ? Smokeless tobacco: Never  ?Vaping Use  ? Vaping Use: Never used  ?Substance and Sexual Activity  ? Alcohol use: Yes  ?  Comment: very seldom  ? Drug use: No  ? Sexual activity: Not on file  ?Other Topics Concern  ? Not on file  ?Social History Narrative  ? Lives with wife  ? Current disabled  ? ?Social Determinants of Health  ? ?Financial Resource Strain: Not on file  ?Food Insecurity: Not on file  ?Transportation Needs: Not on file  ?Physical Activity: Not on file  ?Stress: Not on file  ?Social Connections: Not on file  ?  ? ?Family History: ?The patient's family history includes Cancer in his paternal uncle; Coronary artery disease in an other family member; Diabetes in his mother; Kidney disease in his mother; Stroke in his mother; Tremor in his father. There is no history of Colon cancer, Esophageal cancer, Rectal cancer, or Stomach cancer. ? ?ROS:   ?Please see the history of present illness. All other systems reviewed and are negative. ? ?Labs/Other Studies Reviewed:   ? ?The following studies were reviewed today: ? ?Cardiac monitor 03/2017 ? ?Normal sinus rhythm ?No pauses ?No atrial fibrillation ?Avg. HR 67 bpm. Normal ? ?Echo 03/2017 ? ?- Left ventricle: The cavity size was normal. Wall thickness was  ?  increased in a pattern of mild LVH. Systolic function was normal.  ?  The estimated ejection fraction was in the range of 55% to 60%.  ?  Wall motion was normal; there were no regional wall motion  ?  abnormalities. Doppler parameters are consistent with abnormal  ?  left ventricular relaxation (grade 1 diastolic dysfunction).  ?- Mitral valve: There was systolic anterior motion of the chordal  ?  structures. There was mild regurgitation.  ? ?Impressions:  ? ?- Normal LV systolic function; mild diastolic dysfunction; mild  ?  LVH; mild chordal SAM; mild MR.  ? ?Recent Labs: ?06/19/2021: ALT 20; BUN 14; Creatinine,  Ser 1.02; Hemoglobin 15.0; Platelets 181; Potassium 3.8; Sodium 140  ?Recent Lipid Panel ?   ?Component Value Date/Time  ? CHOL 127 05/10/2020 0831  ? TRIG 77 05/10/2020 0831  ? HDL 42 05/10/2020 0831  ? CHOLHDL 3.0 05/10/2020 0831  ? CHOLHDL 4 01/29/2015 0849  ? VLDL 18.8 01/29/2015 0849  ? [place] 70 05/10/2020 0831  ? ? ?Risk Assessment/Calculations:   ?  ? ? ?Physical Exam:   ? ?VS:  BP 120/62 (BP Location: Left Arm, Patient Position: Sitting, Cuff Size: Normal)   Pulse 64   Ht 6' (1.829 m)   Wt 185 lb 12.8 oz (84.3 kg)   SpO2 96%   BMI 25.20 kg/m?    ? ?  Wt Readings from Last 3 Encounters:  ?11/29/21 185 lb 12.8 oz (84.3 kg)  ?08/07/21 186 lb 3.2 oz (84.5 kg)  ?06/19/21 181 lb (82.1 kg)  ?  ? ?GEN:  Well nourished, well developed in no acute distress ?HEENT: Normal ?NECK: No JVD; No carotid bruits ?CARDIAC: RRR, no murmurs, rubs, gallops ?RESPIRATORY:  Clear to auscultation without rales, wheezing or rhonchi  ?ABDOMEN: Soft, non-tender, non-distended ?MUSCULOSKELETAL:  No edema; No deformity. 2+ pedal pulses, equal bilaterally ?SKIN: Warm and dry ?NEUROLOGIC:  Alert and oriented x 3 ?PSYCHIATRIC:  Normal affect  ? ?EKG:  EKG is not ordered today.  ? ?Diagnoses:   ? ?1. Hyperlipidemia LDL goal <70   ?2. Atherosclerosis of native coronary artery of native heart without angina pectoris   ?3. Orthostatic hypotension   ?4. Mitral valve insufficiency, unspecified etiology   ?5. Chronic heart failure with preserved ejection fraction (HFpEF) ([place])   ? ?Assessment and Plan:   ? ? ?CAD without angina: PCI to RCA in 2003. No activity intolerance. He denies chest pain, dyspnea, or other symptoms concerning for angina.  No indication for further ischemic evaluation at this time.  ? ?Chronic HFpEF: Grade 1 diastolic dysfunction, LVEF 55-60% by echo 03/2017.  Appears euvolemic today.  He denies dyspnea, edema, orthopnea, PND.  Remains active at home with no activity intolerance. GDMT limited by bradycardia, hypotension.   No changes today. ? ?Bradycardia: HR stable at 64 bpm today.  He denies dizziness, presyncope, syncope. Occasional fleeting moments of lightheadedness.  These are not concerning to him. No indication for further eval

## 2021-11-29 ENCOUNTER — Ambulatory Visit: Payer: Medicare PPO | Admitting: Nurse Practitioner

## 2021-11-29 ENCOUNTER — Encounter: Payer: Self-pay | Admitting: *Deleted

## 2021-11-29 ENCOUNTER — Encounter: Payer: Self-pay | Admitting: Nurse Practitioner

## 2021-11-29 VITALS — BP 120/62 | HR 64 | Ht 72.0 in | Wt 185.8 lb

## 2021-11-29 DIAGNOSIS — I2510 Atherosclerotic heart disease of native coronary artery without angina pectoris: Secondary | ICD-10-CM

## 2021-11-29 DIAGNOSIS — I5032 Chronic diastolic (congestive) heart failure: Secondary | ICD-10-CM

## 2021-11-29 DIAGNOSIS — I340 Nonrheumatic mitral (valve) insufficiency: Secondary | ICD-10-CM | POA: Diagnosis not present

## 2021-11-29 DIAGNOSIS — I951 Orthostatic hypotension: Secondary | ICD-10-CM

## 2021-11-29 DIAGNOSIS — E785 Hyperlipidemia, unspecified: Secondary | ICD-10-CM

## 2021-11-29 DIAGNOSIS — E782 Mixed hyperlipidemia: Secondary | ICD-10-CM | POA: Diagnosis not present

## 2021-11-29 LAB — COMPREHENSIVE METABOLIC PANEL
ALT: 22 IU/L (ref 0–44)
AST: 19 IU/L (ref 0–40)
Albumin/Globulin Ratio: 1.9 (ref 1.2–2.2)
Albumin: 4.4 g/dL (ref 3.7–4.7)
Alkaline Phosphatase: 82 IU/L (ref 44–121)
BUN/Creatinine Ratio: 15 (ref 10–24)
BUN: 14 mg/dL (ref 8–27)
Bilirubin Total: 1 mg/dL (ref 0.0–1.2)
CO2: 24 mmol/L (ref 20–29)
Calcium: 9.2 mg/dL (ref 8.6–10.2)
Chloride: 106 mmol/L (ref 96–106)
Creatinine, Ser: 0.94 mg/dL (ref 0.76–1.27)
Globulin, Total: 2.3 g/dL (ref 1.5–4.5)
Glucose: 105 mg/dL — ABNORMAL HIGH (ref 70–99)
Potassium: 3.8 mmol/L (ref 3.5–5.2)
Sodium: 145 mmol/L — ABNORMAL HIGH (ref 134–144)
Total Protein: 6.7 g/dL (ref 6.0–8.5)
eGFR: 85 mL/min/{1.73_m2} (ref >59)

## 2021-11-29 LAB — CBC
Hematocrit: 44.4 % (ref 37.5–51.0)
Hemoglobin: 15.2 g/dL (ref 13.0–17.7)
MCH: 32.1 pg (ref 26.6–33.0)
MCHC: 34.2 g/dL (ref 31.5–35.7)
MCV: 94 fL (ref 79–97)
Platelets: 163 10*3/uL (ref 150–450)
RBC: 4.73 x10E6/uL (ref 4.14–5.80)
RDW: 11.8 % (ref 11.6–15.4)
WBC: 7 10*3/uL (ref 3.4–10.8)

## 2021-11-29 LAB — LIPID PANEL
Chol/HDL Ratio: 3.1 ratio (ref 0.0–5.0)
Cholesterol, Total: 114 mg/dL (ref 100–199)
HDL: 37 mg/dL — ABNORMAL LOW (ref >39)
LDL Chol Calc (NIH): 61 mg/dL (ref 0–99)
Triglycerides: 81 mg/dL (ref 0–149)
VLDL Cholesterol Cal: 16 mg/dL (ref 5–40)

## 2021-11-29 NOTE — Patient Instructions (Signed)
Medication Instructions:  ? ?Your physician recommends that you continue on your current medications as directed. Please refer to the Current Medication list given to you today. ? ? ?*If you need a refill on your cardiac medications before your next appointment, please call your pharmacy* ? ? ?Lab Work: ? ?TODAY!!!!! CMET/LIPID/CBC ? ?If you have labs (blood work) drawn today and your tests are completely normal, you will receive your results only by: ?MyChart Message (if you have MyChart) OR ?A paper copy in the mail ?If you have any lab test that is abnormal or we need to change your treatment, we will call you to review the results. ? ? ?Follow-Up: ?At [facility], you and your health needs are our priority.  As part of our continuing mission to provide you with exceptional heart care, we have created designated Provider Care Teams.  These Care Teams include your primary Cardiologist (physician) and Advanced Practice Providers (APPs -  Physician Assistants and Nurse Practitioners) who all work together to provide you with the care you need, when you need it. ? ?We recommend signing up for the patient portal called "MyChart".  Sign up information is provided on this After Visit Summary.  MyChart is used to connect with patients for Virtual Visits (Telemedicine).  Patients are able to view lab/test results, encounter notes, upcoming appointments, etc.  Non-urgent messages can be sent to your provider as well.   ?To learn more about what you can do with MyChart, go to [email or web address].   ? ?Your next appointment:   ?1 year(s) ? ?The format for your next appointment:   ?In Person ? ?Provider:   ?Candee Furbish, MD   ? ? ?Other Instructions ? ?Your physician wants you to follow-up in: 1 year with Dr.Skains.  You will receive a reminder letter in the mail two months in advance. If you don't receive a letter, please call our office to schedule the follow-up appointment. ? ?  ?

## 2021-12-19 ENCOUNTER — Other Ambulatory Visit: Payer: Self-pay | Admitting: Cardiology

## 2021-12-24 ENCOUNTER — Other Ambulatory Visit: Payer: Self-pay | Admitting: Cardiology

## 2022-02-13 ENCOUNTER — Other Ambulatory Visit: Payer: Self-pay | Admitting: Diagnostic Neuroimaging

## 2022-03-02 ENCOUNTER — Other Ambulatory Visit: Payer: Self-pay | Admitting: Cardiology

## 2022-03-04 ENCOUNTER — Other Ambulatory Visit: Payer: Self-pay | Admitting: Cardiology

## 2022-03-04 NOTE — Telephone Encounter (Signed)
Order Providers  Prescribing Provider Encounter Provider  Jerline Pain, MD Jerline Pain, MD   Outpatient Medication Detail   Disp Refills Start End   isosorbide mononitrate (IMDUR) 60 MG 24 hr tablet 90 tablet 3 12/24/2021    Sig: TAKE 1 TABLET BY MOUTH DAILY   Sent to pharmacy as: isosorbide mononitrate (IMDUR) 60 MG 24 hr tablet   E-Prescribing Status: Receipt confirmed by pharmacy (12/24/2021  1:30 PM EDT)    Pharmacy  [facility], [address] AT [place]   Additional Information  Associated Reports  View Encounter  Priority and Order Details

## 2022-03-08 ENCOUNTER — Encounter (HOSPITAL_COMMUNITY): Payer: Self-pay

## 2022-03-08 ENCOUNTER — Emergency Department (HOSPITAL_COMMUNITY)
Admission: EM | Admit: 2022-03-08 | Discharge: 2022-03-08 | Disposition: A | Discharge status: Home / Self Care | Payer: Medicare PPO | Attending: Emergency Medicine | Admitting: Emergency Medicine

## 2022-03-08 ENCOUNTER — Other Ambulatory Visit: Payer: Self-pay

## 2022-03-08 ENCOUNTER — Emergency Department (HOSPITAL_COMMUNITY): Payer: Medicare PPO

## 2022-03-08 DIAGNOSIS — R42 Dizziness and giddiness: Secondary | ICD-10-CM

## 2022-03-08 DIAGNOSIS — Z7982 Long term (current) use of aspirin: Secondary | ICD-10-CM | POA: Insufficient documentation

## 2022-03-08 DIAGNOSIS — I2510 Atherosclerotic heart disease of native coronary artery without angina pectoris: Secondary | ICD-10-CM | POA: Diagnosis not present

## 2022-03-08 DIAGNOSIS — G20 Parkinson's disease: Secondary | ICD-10-CM | POA: Insufficient documentation

## 2022-03-08 DIAGNOSIS — G319 Degenerative disease of nervous system, unspecified: Secondary | ICD-10-CM | POA: Diagnosis not present

## 2022-03-08 DIAGNOSIS — I639 Cerebral infarction, unspecified: Secondary | ICD-10-CM | POA: Diagnosis not present

## 2022-03-08 DIAGNOSIS — R110 Nausea: Secondary | ICD-10-CM | POA: Diagnosis not present

## 2022-03-08 DIAGNOSIS — R0689 Other abnormalities of breathing: Secondary | ICD-10-CM | POA: Diagnosis not present

## 2022-03-08 DIAGNOSIS — R9082 White matter disease, unspecified: Secondary | ICD-10-CM | POA: Diagnosis not present

## 2022-03-08 LAB — CBC WITH DIFFERENTIAL/PLATELET
Abs Immature Granulocytes: 0.03 10*3/uL (ref 0.00–0.07)
Basophils Absolute: 0.1 10*3/uL (ref 0.0–0.1)
Basophils Relative: 1 %
Eosinophils Absolute: 0.1 10*3/uL (ref 0.0–0.5)
Eosinophils Relative: 1 %
HCT: 44.3 % (ref 39.0–52.0)
Hemoglobin: 15.1 g/dL (ref 13.0–17.0)
Immature Granulocytes: 0 %
Lymphocytes Relative: 26 %
Lymphs Abs: 2.7 10*3/uL (ref 0.7–4.0)
MCH: 32.3 pg (ref 26.0–34.0)
MCHC: 34.1 g/dL (ref 30.0–36.0)
MCV: 94.7 fL (ref 80.0–100.0)
Monocytes Absolute: 0.5 10*3/uL (ref 0.1–1.0)
Monocytes Relative: 4 %
Neutro Abs: 7.2 10*3/uL (ref 1.7–7.7)
Neutrophils Relative %: 68 %
Platelets: 154 10*3/uL (ref 150–400)
RBC: 4.68 MIL/uL (ref 4.22–5.81)
RDW: 12 % (ref 11.5–15.5)
WBC: 10.6 10*3/uL — ABNORMAL HIGH (ref 4.0–10.5)
nRBC: 0 % (ref 0.0–0.2)

## 2022-03-08 LAB — BASIC METABOLIC PANEL
Anion gap: 12 (ref 5–15)
BUN: 11 mg/dL (ref 8–23)
CO2: 25 mmol/L (ref 22–32)
Calcium: 9.3 mg/dL (ref 8.9–10.3)
Chloride: 103 mmol/L (ref 98–111)
Creatinine, Ser: 0.9 mg/dL (ref 0.61–1.24)
GFR, Estimated: >60 mL/min (ref >60)
Glucose, Bld: 117 mg/dL — ABNORMAL HIGH (ref 70–99)
Potassium: 3.9 mmol/L (ref 3.5–5.1)
Sodium: 140 mmol/L (ref 135–145)

## 2022-03-08 LAB — URINALYSIS, ROUTINE W REFLEX MICROSCOPIC
Bacteria, UA: NONE SEEN
Bilirubin Urine: NEGATIVE
Glucose, UA: NEGATIVE mg/dL
Ketones, ur: NEGATIVE mg/dL
Leukocytes,Ua: NEGATIVE
Nitrite: NEGATIVE
Protein, ur: NEGATIVE mg/dL
Specific Gravity, Urine: 1.02 (ref 1.005–1.030)
pH: 5 (ref 5.0–8.0)

## 2022-03-08 LAB — TROPONIN I (HIGH SENSITIVITY)
Troponin I (High Sensitivity): 5 ng/L (ref <18)
Troponin I (High Sensitivity): 6 ng/L (ref <18)

## 2022-03-08 LAB — BRAIN NATRIURETIC PEPTIDE: B Natriuretic Peptide: 56.2 pg/mL (ref 0.0–100.0)

## 2022-03-08 MED ORDER — MECLIZINE HCL 25 MG PO TABS: 25.0000 mg | ORAL_TABLET | Freq: Three times a day (TID) | ORAL | 0 refills | Status: AC | PRN

## 2022-03-08 MED ORDER — MECLIZINE HCL 25 MG PO TABS
25.0000 mg | ORAL_TABLET | Freq: Once | ORAL | Status: AC
  Administered 2022-03-08: 25 mg via ORAL
  Filled 2022-03-08: qty 1

## 2022-03-08 MED ORDER — ONDANSETRON HCL 4 MG/2ML IJ SOLN
4.0000 mg | Freq: Once | INTRAMUSCULAR | Status: AC
  Administered 2022-03-08: 4 mg via INTRAVENOUS
  Filled 2022-03-08: qty 2

## 2022-03-08 MED ORDER — SODIUM CHLORIDE 0.9 % IV BOLUS
1000.0000 mL | Freq: Once | INTRAVENOUS | Status: AC
  Administered 2022-03-08: 1000 mL via INTRAVENOUS

## 2022-03-08 MED ORDER — ONDANSETRON HCL 4 MG PO TABS: 4.0000 mg | ORAL_TABLET | Freq: Three times a day (TID) | ORAL | 0 refills | Status: DC | PRN

## 2022-03-08 NOTE — Discharge Instructions (Signed)
Your history and work-up today did not reveal evidence of acute stroke or other acute abnormality causing your symptoms at this time.  The MRI did not show stroke and given your improvement and resolution of symptoms, we feel you are safe for discharge home.  Please maintain hydration and use the dizzy and nausea medicine to help with symptoms.  If any symptoms change or worsen acutely, please return to the nearest emergency department

## 2022-03-08 NOTE — ED Provider Notes (Signed)
3:45 PM Care assumed from Dr. Langston Masker.  At time of transfer of care, patient is waiting for results of MRI to rule out stroke as etiology of dizziness and lightheadedness.  If imaging is reassuring, plan of care is to follow-up with outpatient PCP.  6:56 PM MRI returned showing no evidence of acute stroke.  No significant changes on chronic changes seen in his brain.  I reassessed patient and he denies further symptoms.  He reports he is feeling much better in regards to nausea, dizziness and lightheadedness.  We will give prescription for meclizine and Zofran and he will maintain hydration and follow-up with his PCP this week.  Patient agrees and was discharged in good condition with improved symptoms.   Clinical Impression: 1. Dizziness   2. Lightheadedness     Disposition: Discharge  Condition: Good  I have discussed the results, Dx and Tx plan with the pt(& family if present). He/she/they expressed understanding and agree(s) with the plan. Discharge instructions discussed at great length. Strict return precautions discussed and pt &/or family have verbalized understanding of the instructions. No further questions at time of discharge.    New Prescriptions   MECLIZINE (ANTIVERT) 25 MG TABLET    Take 1 tablet (25 mg total) by mouth 3 (three) times daily as needed for dizziness.   ONDANSETRON (ZOFRAN) 4 MG TABLET    Take 1 tablet (4 mg total) by mouth every 8 (eight) hours as needed for nausea or vomiting.    Follow Up: Shirline Frees, MD [id number] [place] [address] [facility] EM[facility] DEPARTMENT [address] [id number] mc [place]        Makita Blow, Gwenyth Allegra, MD 03/08/22 1859

## 2022-03-08 NOTE — ED Provider Notes (Signed)
[facility] EMERGENCY DEPARTMENT Provider Note   CSN: [id number] Arrival date & time: 03/08/22  1226     History  Chief Complaint  Patient presents with   Dizziness    [name] is a 76 y.o. male present emerged department with complaint of lightheadedness.  He reports onset of symptoms yesterday evening, though cannot recall when or where he was when it started.  He is somewhat of a poor historian.  His wife reports the patient was complaining of lightheadedness when he got out of bed today.  The patient said that he opened his eyes and was getting out of bed and felt that he was very lightheaded.  He denies vertigo sensation.  He denies history of vertigo.  He reports that he was also nauseated earlier.  He denies chest pain.  While lying in the bed currently feels normal.  They deny any new medications.  He does have a history of coronary disease.  He has a history of "Parkinson's-like symptoms".  He also has a history of a resting tremor.  He reports he drinks plenty of water at home regularly and carries a jug with him.  HPI     Home Medications Prior to Admission medications   Medication Sig Start Date End Date Taking? Authorizing Provider  acetaminophen (TYLENOL) 500 MG tablet Take 500-1,000 mg by mouth every 6 (six) hours as needed for pain.    [provider]  aspirin EC 81 MG tablet Take 81 mg by mouth daily.    [provider]  EPINEPHrine 0.3 mg/0.3 mL IJ SOAJ injection Inject into the skin as needed. 05/22/17   [provider]  famotidine (PEPCID) 40 MG tablet Take 40 mg by mouth daily.  10/02/18   [provider]  isosorbide mononitrate (IMDUR) 60 MG 24 hr tablet TAKE 1 TABLET BY MOUTH DAILY 12/24/21   Jerline Pain, MD  nitroGLYCERIN (NITROSTAT) 0.4 MG SL tablet Place 0.4 mg under the tongue every 5 (five) minutes as needed for chest pain.    [provider]  primidone (MYSOLINE) 50 MG tablet TAKE 1  TABLET(50 MG) BY MOUTH IN THE MORNING AND AT BEDTIME 02/17/22   Penumalli, Earlean Polka, MD  rosuvastatin (CRESTOR) 10 MG tablet TAKE 1 TABLET(10 MG) BY MOUTH AT BEDTIME 12/20/21   Swinyer, Lanice Schwab, NP      Allergies    Shellfish allergy, Clopidogrel bisulfate, and Red wine complex [germanium]    Review of Systems   Review of Systems  Physical Exam Updated Vital Signs BP (!) 160/82   Pulse (!) 50   Temp 98.6 F (37 C) (Oral)   Resp 17   Ht 6' (1.829 m)   Wt 83.9 kg   SpO2 98%   BMI 25.09 kg/m  Physical Exam Constitutional:      General: He is not in acute distress. HENT:     Head: Normocephalic and atraumatic.  Eyes:     Conjunctiva/sclera: Conjunctivae normal.     Pupils: Pupils are equal, round, and reactive to light.  Cardiovascular:     Rate and Rhythm: Normal rate and regular rhythm.  Pulmonary:     Effort: Pulmonary effort is normal. No respiratory distress.  Abdominal:     General: There is no distension.     Tenderness: There is no abdominal tenderness.  Skin:    General: Skin is warm and dry.  Neurological:     General: No focal deficit present.  Mental Status: He is alert and oriented to person, place, and time. Mental status is at baseline.     Cranial Nerves: No cranial nerve deficit.     Sensory: No sensory deficit.     Motor: No weakness.     Comments: Resting or essential tremor  Psychiatric:        Mood and Affect: Mood normal.        Behavior: Behavior normal.     ED Results / Procedures / Treatments   Labs (all labs ordered are listed, but only abnormal results are displayed) Labs Reviewed  BASIC METABOLIC PANEL - Abnormal; Notable for the following components:      Result Value   Glucose, Bld 117 (*)    All other components within normal limits  CBC WITH DIFFERENTIAL/PLATELET - Abnormal; Notable for the following components:   WBC 10.6 (*)    All other components within normal limits  URINALYSIS, ROUTINE W REFLEX MICROSCOPIC -  Abnormal; Notable for the following components:   Hgb urine dipstick SMALL (*)    All other components within normal limits  BRAIN NATRIURETIC PEPTIDE  TROPONIN I (HIGH SENSITIVITY)    EKG EKG Interpretation  Date/Time:  Saturday March 08 2022 12:29:23 EDT Ventricular Rate:  52 PR Interval:  163 QRS Duration: 111 QT Interval:  429 QTC Calculation: 399 R Axis:   30 Text Interpretation: Sinus rhythm RSR' in V1 or V2, right VCD or RVH Borderline ST elevation, anterior leads Confirmed by Octaviano Glow [id number]) on 03/08/2022 2:00:08 PM  Radiology No results found.  Procedures Procedures    Medications Ordered in ED Medications  meclizine (ANTIVERT) tablet 25 mg (25 mg Oral Given 03/08/22 1432)  sodium chloride 0.9 % bolus 1,000 mL (1,000 mLs Intravenous New Bag/Given 03/08/22 1429)    ED Course/ Medical Decision Making/ A&P                           Medical Decision Making Amount and/or Complexity of Data Reviewed Labs: ordered. Radiology: ordered.   This patient presents to the ED with concern for lightheadedness, nausea. This involves an extensive number of treatment options, and is a complaint that carries with it a high risk of complications and morbidity.  The differential diagnosis includes atypical ACS versus CVA versus anemia versus dehydration versus orthostatic hypotension versus parkinsonian complication versus UTI versus other  Additional history obtained from patient's wife at bedside  I ordered and personally interpreted labs.  The pertinent results include: No significant findings.  No evidence of anemia, infection, or troponin abnormality  I ordered imaging studies including MRI of the brain to evaluate for posterior CVA or cerebellar lesion  The patient was maintained on a cardiac monitor.  I personally viewed and interpreted the cardiac monitored which showed an underlying rhythm of: Normal sinus rhythm  Per my interpretation the patient's ECG shows normal  sinus rhythm  I ordered medication including meclizine, fluid bolus for hydration and possible vertigo  Test Considered: Doubt acute pulmonary embolism, do not feel CT PE  Patient signed out to Dr Glynda Jaeger EDP pending f/u on MRI scan         Final Clinical Impression(s) / ED Diagnoses Final diagnoses:  None    Rx / DC Orders ED Discharge Orders     None         Wyvonnia Dusky, MD 03/08/22 1645

## 2022-03-08 NOTE — ED Notes (Signed)
Patient transported to MRI 

## 2022-03-08 NOTE — ED Triage Notes (Addendum)
Pt to ED via [facility] from home c/o dizziness for several days worsening today with movement. Pt has a new dx of\\ Parkinson's symptoms and has f/u apt w/Neurology in August

## 2022-03-11 DIAGNOSIS — R251 Tremor, unspecified: Secondary | ICD-10-CM | POA: Diagnosis not present

## 2022-03-11 DIAGNOSIS — R42 Dizziness and giddiness: Secondary | ICD-10-CM | POA: Diagnosis not present

## 2022-03-20 ENCOUNTER — Telehealth: Payer: Self-pay | Admitting: Cardiology

## 2022-03-20 NOTE — Telephone Encounter (Signed)
Pt sent this via mychart to the [place] pool:      Average of 3 BPs and HR respectively: 116/75 and 68   Have not felt like I was going to faint even with worst symptoms but my legs would not hold me up    Have taken med this morn and severity of symptoms have eased but they never go away- it seems like it happens when I get up from sitting, laying down over night, feel off balance when walking today so doing nothing. Will take BP now and heart rate    Good Morning [name],  Can you tell me a little more about your dizziness so I can get it over to one of our nurse?   STAT if patient feels like he/she is going to faint   Are you dizzy now?   Do you feel faint or have you passed out?   Do you have any other symptoms?   Have you checked your HR and BP (record if available)?     Appointment Request From: [name]  With Provider: Candee Furbish, MD [facility]  Preferred Date Range: 04/02/2022 - 04/04/2022  Preferred Times: Any Time  Reason for visit: Office Visit  Comments: I had an episode of extreme dizziness, could not bear weight to walk and had nausea and vomiting 2 weeks ago Saturday. The [facility] ER determined I did not have a heart attack nor a stroke. I followed up with my primary last week and he wondered about vertigo. I was given meclizine  and zi take it but it does not take care of this dizziness and the balance problems. Could my heart rhythm be off. Do I need to see you to evaluate. Thank you.

## 2022-03-21 ENCOUNTER — Encounter: Payer: Self-pay | Admitting: Cardiology

## 2022-03-21 ENCOUNTER — Ambulatory Visit: Payer: Medicare PPO | Admitting: Cardiology

## 2022-03-21 VITALS — BP 124/62 | HR 65 | Ht 72.0 in | Wt 184.2 lb

## 2022-03-21 DIAGNOSIS — E785 Hyperlipidemia, unspecified: Secondary | ICD-10-CM | POA: Diagnosis not present

## 2022-03-21 DIAGNOSIS — R42 Dizziness and giddiness: Secondary | ICD-10-CM

## 2022-03-21 DIAGNOSIS — I2510 Atherosclerotic heart disease of native coronary artery without angina pectoris: Secondary | ICD-10-CM | POA: Diagnosis not present

## 2022-03-21 NOTE — Progress Notes (Signed)
Cardiology Office Note:    Date:  03/21/2022   ID:  Tyler, [name] 01-29-1946, MRN [id number]  PCP:  Shirline Frees, MD   [facility] Providers Cardiologist:  Candee Furbish, MD     Referring MD: Shirline Frees, MD    History of Present Illness:    [name] is a 76 y.o. male here for evaluation of dizziness.  Describes symptoms of weakness, unsteadiness, "drunky".  However when I asked him if he changes positions quickly bends over or turns his head he does not describe any room spinning.  Symptoms might be worse when he first gets up in the morning.  He usually has to wait for a while before getting out of bed and standing.  Orthostatics were normal here.  No anemia in emergency department on 15 July.  MRI was normal.  Catheterization-2003-RCA PCI. Myoview 2011-no ischemia Echocardiogram 2014-EF 60% with mild MR Echocardiogram 2018 showed EF 60% mild LVH grade 1 diastolic dysfunction mild mitral regurgitation with mild Sam. Event monitor 2018 -Normal sinus rhythm no pauses no atrial fibrillation heart rate 67 on average     Mild bilateral carotid artery stenosis   Hypotension noted in the past-lisinopril 2.5 was stopped because of this.    Past Medical History:  Diagnosis Date   Arthritis    Bronchitis    Coronary artery disease    multiple PCIs. has had pinched diagonal with 80-90% ostial stenosis,  ETT myoview (3/11): 8/03, no ST changes, hypertensive BP response, no evidence for ischemia or infarction, EF 66%      GERD (gastroesophageal reflux disease)    HTN (hypertension)    sees Dr. Samara Snide   Hyperlipidemia    low HDL   Myocardial infarction [facility]) 2003   sees Dr. Marigene Ehlers   Neuromuscular disorder [facility])    essential tremor- pt. reports its genetic   Tremor     Past Surgical History:  Procedure Laterality Date   CATARACT EXTRACTION, BILATERAL Bilateral 2022   COLONOSCOPY  10/25/2007   Dr.Stark   CORONARY ANGIOPLASTY WITH STENT PLACEMENT   2003   HERNIA REPAIR  86/57/8469   umbilical hernia repair   INSERTION OF MESH N/A 10/18/2012   Procedure: INSERTION OF MESH;  Surgeon: Ralene Ok, MD;  Location: [place];  Service: General;  Laterality: N/A;   UMBILICAL HERNIA REPAIR N/A 10/18/2012   Procedure: LAPAROSCOPIC UMBILICAL HERNIA;  Surgeon: Ralene Ok, MD;  Location: [place];  Service: General;  Laterality: N/A;    Current Medications: Current Meds  Medication Sig   acetaminophen (TYLENOL) 500 MG tablet Take 500-1,000 mg by mouth every 6 (six) hours as needed for pain.   aspirin EC 81 MG tablet Take 81 mg by mouth daily.   EPINEPHrine 0.3 mg/0.3 mL IJ SOAJ injection Inject into the skin as needed.   famotidine (PEPCID) 40 MG tablet Take 40 mg by mouth daily.    meclizine (ANTIVERT) 25 MG tablet Take 1 tablet (25 mg total) by mouth 3 (three) times daily as needed for dizziness.   nitroGLYCERIN (NITROSTAT) 0.4 MG SL tablet Place 0.4 mg under the tongue every 5 (five) minutes as needed for chest pain.   primidone (MYSOLINE) 50 MG tablet TAKE 1 TABLET(50 MG) BY MOUTH IN THE MORNING AND AT BEDTIME   rosuvastatin (CRESTOR) 10 MG tablet TAKE 1 TABLET(10 MG) BY MOUTH AT BEDTIME   [DISCONTINUED] isosorbide mononitrate (IMDUR) 60 MG 24 hr tablet TAKE 1 TABLET BY MOUTH DAILY  Allergies:   Shellfish allergy and Clopidogrel bisulfate   Social History   Socioeconomic History   Marital status: Married    Spouse name: [name]   Number of children: 1   Years of education: Not on file   Highest education level: 11th grade  Occupational History   Not on file  Tobacco Use   Smoking status: Former    Types: Cigarettes    Quit date: 12/24/1975    Years since quitting: 46.2   Smokeless tobacco: Never  Vaping Use   Vaping Use: Never used  Substance and Sexual Activity   Alcohol use: Yes    Comment: very seldom   Drug use: No   Sexual activity: Not on file  Other Topics Concern   Not on file  Social History Narrative   Lives  with wife   Current disabled   Social Determinants of Health   Financial Resource Strain: Not on file  Food Insecurity: Not on file  Transportation Needs: Not on file  Physical Activity: Not on file  Stress: Not on file  Social Connections: Not on file     Family History: The patient's family history includes Cancer in his paternal uncle; Coronary artery disease in an other family member; Diabetes in his mother; Kidney disease in his mother; Stroke in his mother; Tremor in his father. There is no history of Colon cancer, Esophageal cancer, Rectal cancer, or Stomach cancer.  ROS:   Please see the history of present illness.     All other systems reviewed and are negative.  EKGs/Labs/Other Studies Reviewed:    The following studies were reviewed today: ER visit, MRI reviewed  EKG:  EKG is  ordered today.  The ekg ordered today demonstrates normal sinus rhythm 65 incomplete right bundle branch block  Recent Labs: 11/29/2021: ALT 22 03/08/2022: B Natriuretic Peptide 56.2; BUN 11; Creatinine, Ser 0.90; Hemoglobin 15.1; Platelets 154; Potassium 3.9; Sodium 140  Recent Lipid Panel    Component Value Date/Time   CHOL 114 11/29/2021 0814   TRIG 81 11/29/2021 0814   HDL 37 (L) 11/29/2021 0814   CHOLHDL 3.1 11/29/2021 0814   CHOLHDL 4 01/29/2015 0849   VLDL 18.8 01/29/2015 0849   LDLCALC 61 11/29/2021 0814     Risk Assessment/Calculations:              Physical Exam:    VS:  BP 124/62   Pulse 65   Ht 6' (1.829 m)   Wt 184 lb 3.2 oz (83.6 kg)   SpO2 96%   BMI 24.98 kg/m     Wt Readings from Last 3 Encounters:  03/21/22 184 lb 3.2 oz (83.6 kg)  03/08/22 185 lb (83.9 kg)  11/29/21 185 lb 12.8 oz (84.3 kg)     GEN:  Well nourished, well developed in no acute distress HEENT: Normal NECK: No JVD; No carotid bruits LYMPHATICS: No lymphadenopathy CARDIAC: RRR, no murmurs, no rubs, gallops RESPIRATORY:  Clear to auscultation without rales, wheezing or rhonchi   ABDOMEN: Soft, non-tender, non-distended MUSCULOSKELETAL:  No edema; No deformity  SKIN: Warm and dry NEUROLOGIC:  Alert and oriented x 3 PSYCHIATRIC:  Normal affect   ASSESSMENT:    1. Dizziness   2. Hyperlipidemia LDL goal <70   3. Atherosclerosis of native coronary artery of native heart without angina pectoris    PLAN:    In order of problems listed above:  Dizziness - Describes a weakness when first getting up in the morning.  His orthostatics  were normal here.  I will go ahead and stop the isosorbide 60 mg in case it is contributing to transient low blood pressure at home.  Hydrate well.  In certain ways he is not describing vertiginous-like symptoms but in other ways he is. Challenging situation. -In addition to stopping the isosorbide, I will place a Zio patch monitor to ensure that he does not have any return of bradycardia contributing to his symptoms. -MRI of brain performed in the emergency department on 7/15.  No evidence of stroke.    Coronary artery disease -RCA PCI 2003 stable with no symptoms of angina, aspirin other goal-directed medical therapy. -Not sure if meclizine is helping currently.   Bradycardia -Stopped propranolol in the past.  Was on half dose because of tremor.  Stable with heart rate 51.  Checking ZIO.  Current heart rate on ECG today was 65.  Incomplete right bundle branch block.   Essential hypertension -Doing well, well controlled.  In fact had hypotension, and had to stop lisinopril at 1 point.  Stopping isosorbide.   Hyperlipidemia -Prior lipid panel showed LDL 61 creatinine 0.9 hemoglobin 15.1.  No evidence of anemia   Essential tremor Low-dose propranolol in the past.  Now on primidone.  He will be seeing Dr. Leta Baptist with neurology soon for his tremor.       Medication Adjustments/Labs and Tests Ordered: Current medicines are reviewed at length with the patient today.  Concerns regarding medicines are outlined above.  Orders Placed  This Encounter  Procedures   LONG TERM MONITOR (3-14 DAYS)   EKG 12-Lead   No orders of the defined types were placed in this encounter.   Patient Instructions  Medication Instructions:  Please discontinue Isosorbide. Continue all other medications as listed.  *If you need a refill on your cardiac medications before your next appointment, please call your pharmacy*  [place] Monitor Instructions  Your physician has requested you wear a ZIO patch monitor for 14 days.  This is a single patch monitor. [employer] supplies one patch monitor per enrollment. Additional stickers are not available. Please do not apply patch if you will be having a Nuclear Stress Test,  Echocardiogram, Cardiac CT, MRI, or Chest Xray during the period you would be wearing the  monitor. The patch cannot be worn during these tests. You cannot remove and re-apply the  ZIO XT patch monitor.  Your ZIO patch monitor will be mailed 3 day USPS to your address on file. It may take 3-5 days  to receive your monitor after you have been enrolled.  Once you have received your monitor, please review the enclosed instructions. Your monitor  has already been registered assigning a specific monitor serial # to you.  Billing and Patient Assistance Program Information  We have supplied [employer] with any of your insurance information on file for billing purposes. [employer] offers a sliding scale Patient Assistance Program for patients that do not have  insurance, or whose insurance does not completely cover the cost of the ZIO monitor.  You must apply for the Patient Assistance Program to qualify for this discounted rate.  To apply, please call [employer] at [phone], select option 4, select option 2, ask to apply for  Patient Assistance Program. Theodore Demark will ask your household income, and how many people  are in your household. They will quote your out-of-pocket cost based on that information.  [employer] will also be able  to set up a 74-month interest-free payment plan if needed.  Applying the monitor   Shave hair from upper left chest.  Hold abrader disc by orange tab. Rub abrader in 40 strokes over the upper left chest as  indicated in your monitor instructions.  Clean area with 4 enclosed alcohol pads. Let dry.  Apply patch as indicated in monitor instructions. Patch will be placed under collarbone on left  side of chest with arrow pointing upward.  Rub patch adhesive wings for 2 minutes. Remove white label marked "1". Remove the white  label marked "2". Rub patch adhesive wings for 2 additional minutes.  While looking in a mirror, press and release button in center of patch. A small green light will  flash 3-4 times. This will be your only indicator that the monitor has been turned on.  Do not shower for the first 24 hours. You may shower after the first 24 hours.  Press the button if you feel a symptom. You will hear a small click. Record Date, Time and  Symptom in the Patient Logbook.  When you are ready to remove the patch, follow instructions on the last 2 pages of Patient  Logbook. Stick patch monitor onto the last page of Patient Logbook.  Place Patient Logbook in the blue and white box. Use locking tab on box and tape box closed  securely. The blue and white box has prepaid postage on it. Please place it in the mailbox as  soon as possible. Your physician should have your test results approximately 7 days after the  monitor has been mailed back to [employer].  Call [place] at [phone] if you have questions regarding  your ZIO XT patch monitor. Call them immediately if you see an orange light blinking on your  monitor.  If your monitor falls off in less than 4 days, contact our Monitor department at [phone].  If your monitor becomes loose or falls off after 4 days call [employer] at [phone] for  suggestions on securing your monitor   Follow-Up: At [facility], you and your health needs are our priority.  As part of our continuing mission to provide you with exceptional heart care, we have created designated Provider Care Teams.  These Care Teams include your primary Cardiologist (physician) and Advanced Practice Providers (APPs -  Physician Assistants and Nurse Practitioners) who all work together to provide you with the care you need, when you need it.  We recommend signing up for the patient portal called "MyChart".  Sign up information is provided on this After Visit Summary.  MyChart is used to connect with patients for Virtual Visits (Telemedicine).  Patients are able to view lab/test results, encounter notes, upcoming appointments, etc.  Non-urgent messages can be sent to your provider as well.   To learn more about what you can do with MyChart, go to [email or web address].    Your next appointment:   As previously scheduled.   Important Information About Sugar         Signed, Candee Furbish, MD  03/21/2022 5:02 PM    [facility]

## 2022-03-21 NOTE — Patient Instructions (Signed)
Medication Instructions:  Please discontinue Isosorbide. Continue all other medications as listed.  *If you need a refill on your cardiac medications before your next appointment, please call your pharmacy*  [place] Monitor Instructions  Your physician has requested you wear a ZIO patch monitor for 14 days.  This is a single patch monitor. [employer] supplies one patch monitor per enrollment. Additional stickers are not available. Please do not apply patch if you will be having a Nuclear Stress Test,  Echocardiogram, Cardiac CT, MRI, or Chest Xray during the period you would be wearing the  monitor. The patch cannot be worn during these tests. You cannot remove and re-apply the  ZIO XT patch monitor.  Your ZIO patch monitor will be mailed 3 day USPS to your address on file. It may take 3-5 days  to receive your monitor after you have been enrolled.  Once you have received your monitor, please review the enclosed instructions. Your monitor  has already been registered assigning a specific monitor serial # to you.  Billing and Patient Assistance Program Information  We have supplied [employer] with any of your insurance information on file for billing purposes. [employer] offers a sliding scale Patient Assistance Program for patients that do not have  insurance, or whose insurance does not completely cover the cost of the ZIO monitor.  You must apply for the Patient Assistance Program to qualify for this discounted rate.  To apply, please call [employer] at [phone], select option 4, select option 2, ask to apply for  Patient Assistance Program. Theodore Demark will ask your household income, and how many people  are in your household. They will quote your out-of-pocket cost based on that information.  [employer] will also be able to set up a 20-month interest-free payment plan if needed.  Applying the monitor   Shave hair from upper left chest.  Hold abrader disc by orange tab. Rub abrader in  40 strokes over the upper left chest as  indicated in your monitor instructions.  Clean area with 4 enclosed alcohol pads. Let dry.  Apply patch as indicated in monitor instructions. Patch will be placed under collarbone on left  side of chest with arrow pointing upward.  Rub patch adhesive wings for 2 minutes. Remove white label marked "1". Remove the white  label marked "2". Rub patch adhesive wings for 2 additional minutes.  While looking in a mirror, press and release button in center of patch. A small green light will  flash 3-4 times. This will be your only indicator that the monitor has been turned on.  Do not shower for the first 24 hours. You may shower after the first 24 hours.  Press the button if you feel a symptom. You will hear a small click. Record Date, Time and  Symptom in the Patient Logbook.  When you are ready to remove the patch, follow instructions on the last 2 pages of Patient  Logbook. Stick patch monitor onto the last page of Patient Logbook.  Place Patient Logbook in the blue and white box. Use locking tab on box and tape box closed  securely. The blue and white box has prepaid postage on it. Please place it in the mailbox as  soon as possible. Your physician should have your test results approximately 7 days after the  monitor [facility]  Call [place] [phone] you have questions regarding  your ZIO XT patch monitor. Call them immediately  if you see an orange light blinking on your  monitor.  If your monitor falls off in less than 4 days, contact our Monitor department at [phone].  If your monitor becomes loose or falls off after 4 days call [employer] at [phone] for  suggestions on securing your monitor   Follow-Up: At [facility], you and your health needs are our priority.  As part of our continuing mission to provide you with exceptional heart care, we have created designated Provider  Care Teams.  These Care Teams include your primary Cardiologist (physician) and Advanced Practice Providers (APPs -  Physician Assistants and Nurse Practitioners) who all work together to provide you with the care you need, when you need it.  We recommend signing up for the patient portal called "MyChart".  Sign up information is provided on this After Visit Summary.  MyChart is used to connect with patients for Virtual Visits (Telemedicine).  Patients are able to view lab/test results, encounter notes, upcoming appointments, etc.  Non-urgent messages can be sent to your provider as well.   To learn more about what you can do with MyChart, go to [email or web address].    Your next appointment:   As previously scheduled.   Important Information About Sugar

## 2022-03-21 NOTE — Telephone Encounter (Signed)
Spoke with patient who requested I speak with his wife regarding his s/s.  Per wife s/s started about 2 weeks ago.  He has been having dizziness with position changes - to the point where he is unable to stand up, nausea, weakness.  Was seen in ED and r/o for MI.  Followed up with PCP who started Meclizine.  Pt reports this helps but dizziness does not go away. Dizziness occurs mostly with position changes but never completely goes away.  He is unable to continue with his normal activities due do the dizziness.  He will come into the office today for further evaluation with Dr Marlou Porch.

## 2022-03-24 ENCOUNTER — Ambulatory Visit (INDEPENDENT_AMBULATORY_CARE_PROVIDER_SITE_OTHER): Payer: Medicare PPO

## 2022-03-24 DIAGNOSIS — R42 Dizziness and giddiness: Secondary | ICD-10-CM

## 2022-03-26 DIAGNOSIS — R42 Dizziness and giddiness: Secondary | ICD-10-CM | POA: Diagnosis not present

## 2022-04-08 ENCOUNTER — Encounter: Payer: Self-pay | Admitting: Diagnostic Neuroimaging

## 2022-04-08 ENCOUNTER — Ambulatory Visit: Payer: Medicare PPO | Admitting: Diagnostic Neuroimaging

## 2022-04-08 VITALS — BP 119/76 | HR 60 | Ht 72.0 in | Wt 184.1 lb

## 2022-04-08 DIAGNOSIS — G250 Essential tremor: Secondary | ICD-10-CM | POA: Diagnosis not present

## 2022-04-08 DIAGNOSIS — G20 Parkinson's disease: Secondary | ICD-10-CM

## 2022-04-08 MED ORDER — CARBIDOPA-LEVODOPA 25-100 MG PO TABS: 1.0000 | ORAL_TABLET | Freq: Three times a day (TID) | ORAL | 6 refills | Status: DC

## 2022-04-08 NOTE — Progress Notes (Signed)
[facility]  PATIENT: [name] DOB: [birth year]  REFERRING CLINICIAN: Shirline Frees, MD HISTORY FROM: patient  REASON FOR VISIT: new consult    HISTORICAL  CHIEF COMPLAINT:  Chief Complaint  Patient presents with   Follow-up    Pt reports being okay. He states his tremors have been the same. His wife states that he has some dizziness and but less frequently. His wife also states last month he had an episode where something just went over him and she sent him to the   ED. Room 7 with wife    HISTORY OF PRESENT ILLNESS:   UPDATE (04/08/22, VRP): Since last visit, had some events of dizziness (1 event in July 2023, dizzy, N/V, couldn't stand up out of bed). ER eval, MRI brain neg. Some other intermittent balance issues. Some int short term memory issues.  Tremor stable to slightly worse (patient not sure; wife thinks worse). Patient no longer driving.   PRIOR HPI (08/07/21): 76 year old male here for evaluation of tremors.  Patient has had long history of postural tremors since childhood.  There is family history of tremor in patient's father and patient's son.  He has been diagnosed with essential tremor and tried on primidone for the last few months without relief (primidone 50 mg at bedtime).   In last year patient also having some issues with shuffling gait and resting tremor according to wife.   REVIEW OF SYSTEMS: Full 14 system review of systems performed and negative with exception of: as per HPI   ALLERGIES: Allergies  Allergen Reactions   Shellfish Allergy Nausea And Vomiting    Diaphoresis, weak, extreme vomiting    Clopidogrel Bisulfate Hives    REACTION: rash    HOME MEDICATIONS: Outpatient Medications Prior to Visit  Medication Sig Dispense Refill   acetaminophen (TYLENOL) 500 MG tablet Take 500-1,000 mg by mouth every 6 (six) hours as needed for pain.     aspirin EC 81 MG tablet Take 81 mg by mouth daily.     EPINEPHrine 0.3 mg/0.3  mL IJ SOAJ injection Inject into the skin as needed.  1   famotidine (PEPCID) 40 MG tablet Take 40 mg by mouth daily.      meclizine (ANTIVERT) 25 MG tablet Take 1 tablet (25 mg total) by mouth 3 (three) times daily as needed for dizziness. 21 tablet 0   nitroGLYCERIN (NITROSTAT) 0.4 MG SL tablet Place 0.4 mg under the tongue every 5 (five) minutes as needed for chest pain.     primidone (MYSOLINE) 50 MG tablet TAKE 1 TABLET(50 MG) BY MOUTH IN THE MORNING AND AT BEDTIME 180 tablet 3   rosuvastatin (CRESTOR) 10 MG tablet TAKE 1 TABLET(10 MG) BY MOUTH AT BEDTIME 90 tablet 3   No facility-administered medications prior to visit.    PAST MEDICAL HISTORY: Past Medical History:  Diagnosis Date   Arthritis    Bronchitis    Coronary artery disease    multiple PCIs. has had pinched diagonal with 80-90% ostial stenosis,  ETT myoview (3/11): 8/03, no ST changes, hypertensive BP response, no evidence for ischemia or infarction, EF 66%      GERD (gastroesophageal reflux disease)    HTN (hypertension)    sees Dr. Samara Snide   Hyperlipidemia    low HDL   Myocardial infarction [facility]) 2003   sees Dr. Marigene Ehlers   Neuromuscular disorder [facility])    essential tremor- pt. reports its genetic   Tremor     PAST SURGICAL  HISTORY: Past Surgical History:  Procedure Laterality Date   CATARACT EXTRACTION, BILATERAL Bilateral 2022   COLONOSCOPY  10/25/2007   Dr.Stark   CORONARY ANGIOPLASTY WITH STENT PLACEMENT  2003   HERNIA REPAIR  [id number]/6433   umbilical hernia repair   INSERTION OF MESH N/A 10/18/2012   Procedure: INSERTION OF MESH;  Surgeon: Ralene Ok, MD;  Location: [place];  Service: General;  Laterality: N/A;   UMBILICAL HERNIA REPAIR N/A 10/18/2012   Procedure: LAPAROSCOPIC UMBILICAL HERNIA;  Surgeon: Ralene Ok, MD;  Location: [place];  Service: General;  Laterality: N/A;    FAMILY HISTORY: Family History  Problem Relation Age of Onset   Stroke Mother    Kidney disease Mother     Diabetes Mother    Tremor Father    Cancer Paternal Uncle        larynx   Coronary artery disease Other        family history   Colon cancer Neg Hx    Esophageal cancer Neg Hx    Rectal cancer Neg Hx    Stomach cancer Neg Hx     SOCIAL HISTORY: Social History   Socioeconomic History   Marital status: Married    Spouse name: [name]   Number of children: 1   Years of education: Not on file   Highest education level: 11th grade  Occupational History   Not on file  Tobacco Use   Smoking status: Former    Types: Cigarettes    Quit date: 12/24/1975    Years since quitting: 46.3   Smokeless tobacco: Never  Vaping Use   Vaping Use: Never used  Substance and Sexual Activity   Alcohol use: Yes    Comment: very seldom   Drug use: No   Sexual activity: Not on file  Other Topics Concern   Not on file  Social History Narrative   Lives with wife   Current disabled   Social Determinants of Health   Financial Resource Strain: Not on file  Food Insecurity: Not on file  Transportation Needs: Not on file  Physical Activity: Not on file  Stress: Not on file  Social Connections: Not on file  Intimate Partner Violence: Not on file     PHYSICAL EXAM  GENERAL EXAM/CONSTITUTIONAL: Vitals:  Vitals:   04/08/22 0904  BP: 119/76  Pulse: 60  Weight: 184 lb 2 oz (83.5 kg)  Height: 6' (1.829 m)   Body mass index is 24.97 kg/m. Wt Readings from Last 3 Encounters:  04/08/22 184 lb 2 oz (83.5 kg)  03/21/22 184 lb 3.2 oz (83.6 kg)  03/08/22 185 lb (83.9 kg)   Patient is in no distress; well developed, nourished and groomed; neck is supple  CARDIOVASCULAR: Examination of carotid arteries is normal; no carotid bruits Regular rate and rhythm, no murmurs Examination of peripheral vascular system by observation and palpation is normal  EYES: Ophthalmoscopic exam of optic discs and posterior segments is normal; no papilledema or hemorrhages No results  found.  MUSCULOSKELETAL: Gait, strength, tone, movements noted in Neurologic exam below  NEUROLOGIC: MENTAL STATUS:      No data to display         awake, alert, oriented to person, place and time recent and remote memory intact normal attention and concentration language fluent, comprehension intact, naming intact fund of knowledge appropriate  CRANIAL NERVE:  2nd - no papilledema on fundoscopic exam 2nd, 3rd, 4th, 6th - pupils equal and reactive to light, visual fields full to  confrontation, extraocular muscles intact, no nystagmus 5th - facial sensation symmetric 7th - facial strength symmetric 8th - hearing intact 9th - palate elevates symmetrically, uvula midline 11th - shoulder shrug symmetric 12th - tongue protrusion midline  MOTOR:  normal bulk and tone, full strength in the BUE, BLE POSTURAL TREMOR IN BUE RARE RESTING TREMOR IN BUE MOD COGWEELING IN RUE > LUE;  MOD BRADYKINESIA IN LUE > RUE and LLE  SENSORY:  normal and symmetric to light touch, temperature, vibration  COORDINATION:  finger-nose-finger, fine finger movements normal  REFLEXES:  deep tendon reflexes 1+ and symmetric  GAIT/STATION:  narrow based gait; DECR ARM SWING; STOOPED POSTURE     DIAGNOSTIC DATA (LABS, IMAGING, TESTING) - I reviewed patient records, labs, notes, testing and imaging myself where available.  Lab Results  Component Value Date   WBC 10.6 (H) 03/08/2022   HGB 15.1 03/08/2022   HCT 44.3 03/08/2022   MCV 94.7 03/08/2022   PLT 154 03/08/2022      Component Value Date/Time   NA 140 03/08/2022 1423   NA 145 (H) 11/29/2021 0814   K 3.9 03/08/2022 1423   CL 103 03/08/2022 1423   CO2 25 03/08/2022 1423   GLUCOSE 117 (H) 03/08/2022 1423   BUN 11 03/08/2022 1423   BUN 14 11/29/2021 0814   CREATININE 0.90 03/08/2022 1423   CALCIUM 9.3 03/08/2022 1423   PROT 6.7 11/29/2021 0814   ALBUMIN 4.4 11/29/2021 0814   AST 19 11/29/2021 0814   ALT 22 11/29/2021 0814    ALKPHOS 82 11/29/2021 0814   BILITOT 1.0 11/29/2021 0814   GFRNONAA >60 03/08/2022 1423   GFRAA 100 05/10/2020 0831   Lab Results  Component Value Date   CHOL 114 11/29/2021   HDL 37 (L) 11/29/2021   LDLCALC 61 11/29/2021   TRIG 81 11/29/2021   CHOLHDL 3.1 11/29/2021   No results found for: "HGBA1C" No results found for: "VITAMINB12" Lab Results  Component Value Date   TSH 1.470 10/12/2018    06/19/21 MRI brain [I reviewed images myself and agree with interpretation. -VRP]  1. No acute intracranial pathology. 2. Global parenchymal volume loss and chronic white matter microangiopathy.  09/03/21 DATscan - Mild decreased radiotracer activity within normal shape striatum. Decreased activity greater on the RIGHT. Equivocal exam for parkinsonian syndrome pathology. Recommend clinical correlation.       ASSESSMENT AND PLAN  76 y.o. year old male here with longstanding postural tremor consistent with essential/familial tremor.  Has had some change in symptoms starting in 2021, suspicious for parkinsonism.   Dx:  1. Parkinsonism, unspecified Parkinsonism type ([place])   2. Essential tremor       PLAN:  RESTING TREMOR, BRADYKINESIA, COGWHEELING RIGIDITY, GAIT DIFF - suspect mild parkinsonism - start carb/levo (25/100) half tab three times a day with meals; after 2 weeks, then increase to 1 tab three times a day  - fall precautions reviewed; consider PT evaluation  ESSENTIAL TREMOR - wean off primidone '50mg'$  twice a day (will try carb/levo instead) - reduce primidone to '50mg'$  at bedtime x 2 weeks, then stop   Meds ordered this encounter  Medications   carbidopa-levodopa (SINEMET IR) 25-100 MG tablet    Sig: Take 1 tablet by mouth 3 (three) times daily before meals.    Dispense:  90 tablet    Refill:  6   Return in about 6 months (around 10/09/2022).    Penni Bombard, MD 3/81/8299, 3:71 AM Certified in Neurology, Neurophysiology and  Neuroimaging  [facility] [address] [place], [place] [address] [phone]

## 2022-04-08 NOTE — Patient Instructions (Addendum)
  RESTING TREMOR, BRADYKINESIA, COGWHEELING RIGIDITY, GAIT DIFF - suspect mild parkinsonism - start carb/levo (25/100) half tab three times a day with meals; after 2 weeks, then increase to 1 tab three times a day  - fall precautions reviewed; consider PT evaluation  ESSENTIAL TREMOR - wean off primidone '50mg'$  twice a day (will try carb/levo instead) - reduce primidone to '50mg'$  at bedtime x 2 weeks, then stop

## 2022-04-18 DIAGNOSIS — I509 Heart failure, unspecified: Secondary | ICD-10-CM | POA: Diagnosis not present

## 2022-04-18 DIAGNOSIS — R0989 Other specified symptoms and signs involving the circulatory and respiratory systems: Secondary | ICD-10-CM | POA: Diagnosis not present

## 2022-04-18 DIAGNOSIS — R42 Dizziness and giddiness: Secondary | ICD-10-CM | POA: Diagnosis not present

## 2022-04-18 DIAGNOSIS — Z720 Tobacco use: Secondary | ICD-10-CM | POA: Diagnosis not present

## 2022-04-18 DIAGNOSIS — G20 Parkinson's disease: Secondary | ICD-10-CM | POA: Diagnosis not present

## 2022-04-18 DIAGNOSIS — K219 Gastro-esophageal reflux disease without esophagitis: Secondary | ICD-10-CM | POA: Diagnosis not present

## 2022-04-18 DIAGNOSIS — I25118 Atherosclerotic heart disease of native coronary artery with other forms of angina pectoris: Secondary | ICD-10-CM | POA: Diagnosis not present

## 2022-04-18 DIAGNOSIS — R011 Cardiac murmur, unspecified: Secondary | ICD-10-CM | POA: Diagnosis not present

## 2022-04-18 DIAGNOSIS — Z87891 Personal history of nicotine dependence: Secondary | ICD-10-CM | POA: Diagnosis not present

## 2022-04-22 ENCOUNTER — Other Ambulatory Visit: Payer: Self-pay | Admitting: Family

## 2022-04-22 ENCOUNTER — Other Ambulatory Visit: Payer: Self-pay

## 2022-04-22 DIAGNOSIS — Z79899 Other long term (current) drug therapy: Secondary | ICD-10-CM | POA: Diagnosis not present

## 2022-04-22 DIAGNOSIS — R42 Dizziness and giddiness: Secondary | ICD-10-CM

## 2022-04-22 DIAGNOSIS — Z1159 Encounter for screening for other viral diseases: Secondary | ICD-10-CM | POA: Diagnosis not present

## 2022-04-22 DIAGNOSIS — Z125 Encounter for screening for malignant neoplasm of prostate: Secondary | ICD-10-CM | POA: Diagnosis not present

## 2022-04-24 ENCOUNTER — Ambulatory Visit
Admission: RE | Admit: 2022-04-24 | Discharge: 2022-04-24 | Disposition: A | Discharge status: Home / Self Care | Payer: Medicare PPO | Source: Ambulatory Visit | Attending: Family | Admitting: Family

## 2022-04-24 DIAGNOSIS — R42 Dizziness and giddiness: Secondary | ICD-10-CM

## 2022-04-26 IMAGING — NM NM DATSCAN
2 series · 12 of 12 positions shown · non-contrast
Comparison: None.

CLINICAL DATA: 75-year-old male with postural tremors. Female
history of tremors.

EXAM:
NUCLEAR MEDICINE BRAIN IMAGING WITH SPECT  (DaTscan )
TECHNIQUE: SPECT images of the brain were obtained after intravenous injection
of radiopharmaceutical. 4 hour post injection imaging. Appropriate
positioning.
130 mg JENS STAT given orally for thyroid blockade.
RADIOPHARMACEUTICALS:  4.3 millicuries I 123 Ioflupane

[Series 1: spect - 159 kev _(id)_cor · 4.1mm · 4.14mm/px · 6 of 128 frames shown]
[frame 11/128]
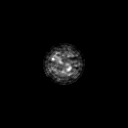
[frame 32/128]
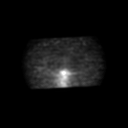
[frame 54/128]
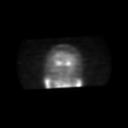
[frame 75/128]
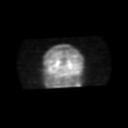
[frame 96/128]
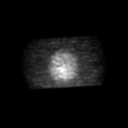
[frame 118/128]
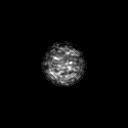

[Series 1: spect - 159 kev _(id)_tra · 4.1mm · 4.14mm/px · 6 of 128 frames shown]
[frame 11/128]
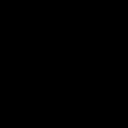
[frame 32/128]
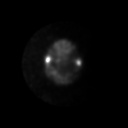
[frame 54/128]
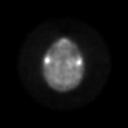
[frame 75/128]
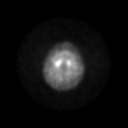
[frame 96/128]
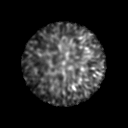
[frame 118/128]
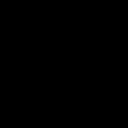

[12 of 12 positions shown; findings below may reference images not displayed]

FINDINGS: The LEFT and RIGHT striatum are normal shaped; however, there is
decreased relative radiotracer activity within LEFT and RIGHT
striatum. Decrease in activity is greater on the RIGHT than the
LEFT.
IMPRESSION: Mild decreased radiotracer activity within normal shape striatum.
Decreased activity greater on the RIGHT. Equivocal exam for
parkinsonian syndrome pathology. Recommend clinical correlation.

Of note, DaTSCAN is not diagnostic of Parkinsonian syndromes, which
remains a clinical diagnosis. DaTscan is an adjuvant test to aid in
the clinical diagnosis of Parkinsonian syndromes.

## 2022-09-25 ENCOUNTER — Other Ambulatory Visit: Payer: Self-pay | Admitting: Nurse Practitioner

## 2022-09-25 DIAGNOSIS — I340 Nonrheumatic mitral (valve) insufficiency: Secondary | ICD-10-CM

## 2022-10-14 ENCOUNTER — Encounter: Payer: Self-pay | Admitting: Diagnostic Neuroimaging

## 2022-10-14 ENCOUNTER — Ambulatory Visit: Payer: Medicare PPO | Admitting: Diagnostic Neuroimaging

## 2022-10-14 VITALS — BP 125/69 | HR 78 | Ht 72.0 in | Wt 164.4 lb

## 2022-10-14 DIAGNOSIS — G250 Essential tremor: Secondary | ICD-10-CM | POA: Diagnosis not present

## 2022-10-14 DIAGNOSIS — G20C Parkinsonism, unspecified: Secondary | ICD-10-CM

## 2022-10-14 NOTE — Progress Notes (Signed)
[facility]  PATIENT: [name] DOB: November 18, 1945  REFERRING CLINICIAN: Shirline Frees, MD HISTORY FROM: patient  REASON FOR VISIT: follow up   HISTORICAL  CHIEF COMPLAINT:  Chief Complaint  Patient presents with   Follow-up    Patient in room #7 with his wife. Pt here today for f/u on Parkinsonism.    HISTORY OF PRESENT ILLNESS:   UPDATE (10/14/22, VRP): Since last visit, doing about the same. Tolerating meds. Tried topiramate (per PCP), no benefit. Some intermittent dizziness and slowing down. Overall tremor is worsening.  UPDATE (04/08/22, VRP): Since last visit, had some events of dizziness (1 event in July 2023, dizzy, N/V, couldn't stand up out of bed). ER eval, MRI brain neg. Some other intermittent balance issues. Some int short term memory issues.  Tremor stable to slightly worse (patient not sure; wife thinks worse). Patient no longer driving.   PRIOR HPI (08/07/21): 77 year old male here for evaluation of tremors.  Patient has had long history of postural tremors since childhood.  There is family history of tremor in patient's father and patient's son.  He has been diagnosed with essential tremor and tried on primidone for the last few months without relief (primidone 50 mg at bedtime).   In last year patient also having some issues with shuffling gait and resting tremor according to wife.   REVIEW OF SYSTEMS: Full 14 system review of systems performed and negative with exception of: as per HPI   ALLERGIES: Allergies  Allergen Reactions   Shellfish Allergy Nausea And Vomiting    Diaphoresis, weak, extreme vomiting    Clopidogrel Bisulfate Hives    REACTION: rash    HOME MEDICATIONS: Outpatient Medications Prior to Visit  Medication Sig Dispense Refill   acetaminophen (TYLENOL) 500 MG tablet Take 500-1,000 mg by mouth every 6 (six) hours as needed for pain.     aspirin EC 81 MG tablet Take 81 mg by mouth daily.     carbidopa-levodopa  (SINEMET IR) 25-100 MG tablet Take 1 tablet by mouth 3 (three) times daily before meals. 90 tablet 6   EPINEPHrine 0.3 mg/0.3 mL IJ SOAJ injection Inject into the skin as needed.  1   famotidine (PEPCID) 40 MG tablet Take 40 mg by mouth daily.      meclizine (ANTIVERT) 25 MG tablet Take 1 tablet (25 mg total) by mouth 3 (three) times daily as needed for dizziness. 21 tablet 0   nitroGLYCERIN (NITROSTAT) 0.4 MG SL tablet Place 0.4 mg under the tongue every 5 (five) minutes as needed for chest pain.     rosuvastatin (CRESTOR) 10 MG tablet TAKE 1 TABLET(10 MG) BY MOUTH AT BEDTIME 90 tablet 3   topiramate (TOPAMAX) 100 MG tablet Take 100 mg by mouth 2 (two) times daily.     primidone (MYSOLINE) 50 MG tablet TAKE 1 TABLET(50 MG) BY MOUTH IN THE MORNING AND AT BEDTIME (Patient not taking: Reported on 10/14/2022) 180 tablet 3   No facility-administered medications prior to visit.    PAST MEDICAL HISTORY: Past Medical History:  Diagnosis Date   Arthritis    Bronchitis    Coronary artery disease    multiple PCIs. has had pinched diagonal with 80-90% ostial stenosis,  ETT myoview (3/11): 8/03, no ST changes, hypertensive BP response, no evidence for ischemia or infarction, EF 66%      GERD (gastroesophageal reflux disease)    HTN (hypertension)    sees Dr. Samara Snide   Hyperlipidemia    low  HDL   Myocardial infarction [facility]) 2003   sees Dr. Marigene Ehlers   Neuromuscular disorder [facility])    essential tremor- pt. reports its genetic   Tremor     PAST SURGICAL HISTORY: Past Surgical History:  Procedure Laterality Date   CATARACT EXTRACTION, BILATERAL Bilateral 2022   COLONOSCOPY  10/25/2007   Dr.Stark   CORONARY ANGIOPLASTY WITH STENT PLACEMENT  2003   HERNIA REPAIR  [id number]   umbilical hernia repair   INSERTION OF MESH N/A 10/18/2012   Procedure: INSERTION OF MESH;  Surgeon: Ralene Ok, MD;  Location: [place];  Service: General;  Laterality: N/A;   UMBILICAL HERNIA REPAIR N/A 10/18/2012    Procedure: LAPAROSCOPIC UMBILICAL HERNIA;  Surgeon: Ralene Ok, MD;  Location: [place];  Service: General;  Laterality: N/A;    FAMILY HISTORY: Family History  Problem Relation Age of Onset   Stroke Mother    Kidney disease Mother    Diabetes Mother    Tremor Father    Cancer Paternal Uncle        larynx   Coronary artery disease Other        family history   Colon cancer Neg Hx    Esophageal cancer Neg Hx    Rectal cancer Neg Hx    Stomach cancer Neg Hx     SOCIAL HISTORY: Social History   Socioeconomic History   Marital status: Married    Spouse name: [name]   Number of children: 1   Years of education: Not on file   Highest education level: 11th grade  Occupational History   Not on file  Tobacco Use   Smoking status: Former    Types: Cigarettes    Quit date: 12/24/1975    Years since quitting: 46.8   Smokeless tobacco: Never  Vaping Use   Vaping Use: Never used  Substance and Sexual Activity   Alcohol use: Yes    Comment: very seldom   Drug use: No   Sexual activity: Not on file  Other Topics Concern   Not on file  Social History Narrative   Lives with wife   Current disabled   Social Determinants of Health   Financial Resource Strain: Not on file  Food Insecurity: Not on file  Transportation Needs: Not on file  Physical Activity: Not on file  Stress: Not on file  Social Connections: Not on file  Intimate Partner Violence: Not on file     PHYSICAL EXAM  GENERAL EXAM/CONSTITUTIONAL: Vitals:  Vitals:   10/14/22 1344  BP: 125/69  Pulse: 78  Weight: 164 lb 6.4 oz (74.6 kg)  Height: 6' (1.829 m)   Body mass index is 22.3 kg/m. Wt Readings from Last 3 Encounters:  10/14/22 164 lb 6.4 oz (74.6 kg)  04/08/22 184 lb 2 oz (83.5 kg)  03/21/22 184 lb 3.2 oz (83.6 kg)   Patient is in no distress; well developed, nourished and groomed; neck is supple  CARDIOVASCULAR: Examination of carotid arteries is normal; no carotid bruits Regular rate  and rhythm, no murmurs Examination of peripheral vascular system by observation and palpation is normal  EYES: Ophthalmoscopic exam of optic discs and posterior segments is normal; no papilledema or hemorrhages No results found.  MUSCULOSKELETAL: Gait, strength, tone, movements noted in Neurologic exam below  NEUROLOGIC: MENTAL STATUS:      No data to display         awake, alert, oriented to person, place and time recent and remote memory intact normal attention and  concentration language fluent, comprehension intact, naming intact fund of knowledge appropriate  CRANIAL NERVE:  2nd - no papilledema on fundoscopic exam 2nd, 3rd, 4th, 6th - pupils equal and reactive to light, visual fields full to confrontation, extraocular muscles intact, no nystagmus 5th - facial sensation symmetric 7th - facial strength symmetric 8th - hearing intact 9th - palate elevates symmetrically, uvula midline 11th - shoulder shrug symmetric 12th - tongue protrusion midline  MOTOR:  normal bulk and tone, full strength in the BUE, BLE POSTURAL TREMOR IN BUE RARE RESTING TREMOR IN BUE MOD COGWEELING IN RUE > LUE;  MOD BRADYKINESIA IN LUE > RUE and LLE  SENSORY:  normal and symmetric to light touch, temperature, vibration  COORDINATION:  finger-nose-finger, fine finger movements normal  REFLEXES:  deep tendon reflexes 1+ and symmetric  GAIT/STATION:  narrow based gait; DECR ARM SWING; STOOPED POSTURE     DIAGNOSTIC DATA (LABS, IMAGING, TESTING) - I reviewed patient records, labs, notes, testing and imaging myself where available.  Lab Results  Component Value Date   WBC 10.6 (H) 03/08/2022   HGB 15.1 03/08/2022   HCT 44.3 03/08/2022   MCV 94.7 03/08/2022   PLT 154 03/08/2022      Component Value Date/Time   NA 140 03/08/2022 1423   NA 145 (H) 11/29/2021 0814   K 3.9 03/08/2022 1423   CL 103 03/08/2022 1423   CO2 25 03/08/2022 1423   GLUCOSE 117 (H) 03/08/2022 1423   BUN  11 03/08/2022 1423   BUN 14 11/29/2021 0814   CREATININE 0.90 03/08/2022 1423   CALCIUM 9.3 03/08/2022 1423   PROT 6.7 11/29/2021 0814   ALBUMIN 4.4 11/29/2021 0814   AST 19 11/29/2021 0814   ALT 22 11/29/2021 0814   ALKPHOS 82 11/29/2021 0814   BILITOT 1.0 11/29/2021 0814   GFRNONAA >60 03/08/2022 1423   GFRAA 100 05/10/2020 0831   Lab Results  Component Value Date   CHOL 114 11/29/2021   HDL 37 (L) 11/29/2021   LDLCALC 61 11/29/2021   TRIG 81 11/29/2021   CHOLHDL 3.1 11/29/2021   No results found for: "HGBA1C" No results found for: "VITAMINB12" Lab Results  Component Value Date   TSH 1.470 10/12/2018    06/19/21 MRI brain [I reviewed images myself and agree with interpretation. -VRP]  1. No acute intracranial pathology. 2. Global parenchymal volume loss and chronic white matter microangiopathy.  09/03/21 DATscan - Mild decreased radiotracer activity within normal shape striatum. Decreased activity greater on the RIGHT. Equivocal exam for parkinsonian syndrome pathology. Recommend clinical correlation.      ASSESSMENT AND PLAN  77 y.o. year old male here with longstanding postural tremor consistent with essential/familial tremor.  Has had some change in symptoms starting in 2021, suspicious for parkinsonism.   Dx:  1. Parkinsonism, unspecified Parkinsonism type   2. Essential tremor      PLAN:  PARKINSONISM --> RESTING TREMOR, BRADYKINESIA, COGWHEELING RIGIDITY, GAIT DIFF - continue carb/levo (25/100) 1 tab three times a day; may increase to 2 tabs three times a day after stopping topirmate - fall precautions reviewed; consider PT evaluation  ESSENTIAL TREMOR - tried primidone; not much benefit - tried topiramate; not much benefit; reduce to 139m daily x 1 week, then stop  Return in about 8 months (around 06/14/2023).    VPenni Bombard MD [id number] Certified in Neurology, Neurophysiology and Neuroimaging  [facility] [address] [place] [phone]

## 2022-10-14 NOTE — Patient Instructions (Addendum)
PARKINSONISM --> RESTING TREMOR, BRADYKINESIA, COGWHEELING RIGIDITY, GAIT DIFF - continue carb/levo (25/100) 1 tab three times a day; may increase to 2 tabs three times a day after stopping topirmate - fall precautions reviewed; consider PT evaluation  ESSENTIAL TREMOR - tried primidone; not much benefit - tried topiramate; not much benefit; reduce to 177m daily x 1 week, then stop

## 2022-10-22 ENCOUNTER — Encounter: Payer: Self-pay | Admitting: Diagnostic Neuroimaging

## 2022-10-22 NOTE — Telephone Encounter (Signed)
I called patient x 2. No answer.  I think it would be best to have a phone conversation to discuss these issues. In the meantime, here are some information:  - I do not have documentation from [facility]. We can reach out and get last notes.  -I recommend to wean off of topiramate because it did not help improve the tremors I do not expect tremors to change when coming off this medication because it did not help the tremors in the first place.  -I hope that increasing carbidopa levodopa will help reduce the tremors.  Most people do better with increasing doses of this medication.  If there are any side effects then we can reduce the dose.  This medication should not affect his heart or be dangerous as questioned.  -Monitor tremors, timing of medication, fluctuation of symptoms.  You can wrote this in a journal and report back after 1 to 2 months.  -I have sent my notes to his PCP.  His cardiologist is in the same system so that information can be shared.  -Regular lab monitoring is not recommended for carbidopa levodopa.  -If needed we can send in another PT evaluation to help with the gait and balance difficulty.   Penni Bombard, MD [id number], [id number] PM Certified in Neurology, Neurophysiology and Neuroimaging  [facility] [address] [place], [place] [address] [phone]

## 2022-11-25 ENCOUNTER — Telehealth: Payer: Self-pay | Admitting: Diagnostic Neuroimaging

## 2022-11-25 MED ORDER — CARBIDOPA-LEVODOPA 25-100 MG PO TABS: 2.0000 | ORAL_TABLET | Freq: Three times a day (TID) | ORAL | 1 refills | Status: DC

## 2022-11-25 NOTE — Telephone Encounter (Signed)
Medication has been refilled for a 90 day supply at the increased dose.

## 2022-11-25 NOTE — Telephone Encounter (Signed)
Pt wife called. Stated Dr. Leta Baptist increased carbidopa-levodopa (SINEMET IR) 25-100 MG tablet to six pills a day, two at each meal. She is requesting an updated prescription be sent to pharmacy.

## 2022-12-01 ENCOUNTER — Ambulatory Visit (HOSPITAL_COMMUNITY): Payer: Medicare PPO

## 2022-12-25 ENCOUNTER — Ambulatory Visit (HOSPITAL_COMMUNITY): Payer: Medicare PPO | Attending: Cardiovascular Disease

## 2022-12-25 DIAGNOSIS — I340 Nonrheumatic mitral (valve) insufficiency: Secondary | ICD-10-CM | POA: Diagnosis present

## 2022-12-25 LAB — ECHOCARDIOGRAM COMPLETE
Area-P 1/2: 2.38 cm2
S' Lateral: 2.4 cm

## 2022-12-25 MED ORDER — PERFLUTREN LIPID MICROSPHERE
1.0000 mL | INTRAVENOUS | Status: AC | PRN
  Administered 2022-12-25: 2 mL via INTRAVENOUS

## 2022-12-31 ENCOUNTER — Encounter (HOSPITAL_BASED_OUTPATIENT_CLINIC_OR_DEPARTMENT_OTHER): Payer: Self-pay | Admitting: Cardiology

## 2022-12-31 ENCOUNTER — Ambulatory Visit (HOSPITAL_BASED_OUTPATIENT_CLINIC_OR_DEPARTMENT_OTHER): Payer: Medicare PPO | Admitting: Cardiology

## 2022-12-31 VITALS — BP 128/76 | HR 65 | Ht 72.0 in | Wt 162.6 lb

## 2022-12-31 DIAGNOSIS — G20A1 Parkinson's disease without dyskinesia, without mention of fluctuations: Secondary | ICD-10-CM | POA: Diagnosis not present

## 2022-12-31 DIAGNOSIS — R42 Dizziness and giddiness: Secondary | ICD-10-CM

## 2022-12-31 NOTE — Patient Instructions (Signed)
Medication Instructions:   Your physician recommends that you continue on your current medications as directed. Please refer to the Current Medication list given to you today.  *If you need a refill on your cardiac medications before your next appointment, please call your pharmacy*    Follow-Up: At [facility], you and your health needs are our priority.  As part of our continuing mission to provide you with exceptional heart care, we have created designated Provider Care Teams.  These Care Teams include your primary Cardiologist (physician) and Advanced Practice Providers (APPs -  Physician Assistants and Nurse Practitioners) who all work together to provide you with the care you need, when you need it.  We recommend signing up for the patient portal called "MyChart".  Sign up information is provided on this After Visit Summary.  MyChart is used to connect with patients for Virtual Visits (Telemedicine).  Patients are able to view lab/test results, encounter notes, upcoming appointments, etc.  Non-urgent messages can be sent to your provider as well.   To learn more about what you can do with MyChart, go to [email or web address].    Your next appointment:   1 year(s)  Provider:   Mark Skains, MD      

## 2022-12-31 NOTE — Progress Notes (Signed)
Cardiology Office Note:    Date:  12/31/2022   ID:  [name], [name] [birth year], MRN [id number]  PCP:  Raymon Mutton., FNP   [facility] Providers Cardiologist:  Donato Schultz, MD     Referring MD: Raymon Mutton., FNP    History of Present Illness:    [name] is a 77 y.o. male here for follow up of of dizziness.  ECHO and ZIO were reassuring. Described symptoms of weakness, unsteadiness, "drunky".  However when I asked him if he changes positions quickly bends over or turns his head he does not describe any room spinning.  Symptoms might be worse when he first gets up in the morning.  He usually has to wait for a while before getting out of bed and standing.  Orthostatics were normal here.  No anemia in emergency department on 15 July.  MRI was normal.  He has been diagnosed with Parkinson's.  He is currently on carbidopa levodopa.  Doing better.  Catheterization-2003-RCA PCI. Myoview 2011-no ischemia   Mild bilateral carotid artery stenosis   Hypotension noted in the past-lisinopril 2.5 was stopped because of this.    Past Medical History:  Diagnosis Date   Arthritis    Bronchitis    Coronary artery disease    multiple PCIs. has had pinched diagonal with 80-90% ostial stenosis,  ETT myoview (3/11): 8/03, no ST changes, hypertensive BP response, no evidence for ischemia or infarction, EF 66%      GERD (gastroesophageal reflux disease)    HTN (hypertension)    sees Dr. Leonides Sake   Hyperlipidemia    low HDL   Myocardial infarction [facility]) 2003   sees Dr. 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   Tremor     Past Surgical History:  Procedure Laterality Date   CATARACT EXTRACTION, BILATERAL Bilateral 2022   COLONOSCOPY  10/25/2007   Dr.Stark   CORONARY ANGIOPLASTY WITH STENT PLACEMENT  2003   HERNIA REPAIR  10/18/2012   umbilical hernia repair   INSERTION OF MESH N/A 10/18/2012   Procedure: INSERTION OF  MESH;  Surgeon: Axel Filler, MD;  Location: [facility] OR;  Service: General;  Laterality: N/A;   UMBILICAL HERNIA REPAIR N/A 10/18/2012   Procedure: LAPAROSCOPIC UMBILICAL HERNIA;  Surgeon: Axel Filler, MD;  Location: [facility] OR;  Service: General;  Laterality: N/A;    Current Medications: Current Meds  Medication Sig   aspirin EC 81 MG tablet Take 81 mg by mouth daily.   carbidopa-levodopa (SINEMET IR) 25-100 MG tablet Take 2 tablets by mouth 3 (three) times daily before meals.   famotidine (PEPCID) 40 MG tablet Take 40 mg by mouth daily.    rosuvastatin (CRESTOR) 10 MG tablet TAKE 1 TABLET(10 MG) BY MOUTH AT BEDTIME     Allergies:   Shellfish allergy and Clopidogrel bisulfate   Social History   Socioeconomic History   Marital status: Married    Spouse name: Aurea Graff   Number of children: 1   Years of education: Not on file   Highest education level: 11th grade  Occupational History   Not on file  Tobacco Use   Smoking status: Former    Types: Cigarettes    Quit date: 12/24/1975    Years since quitting: 47.0   Smokeless tobacco: Never  Vaping Use   Vaping Use: Never used  Substance and Sexual Activity   Alcohol use: Yes    Comment: very  seldom   Drug use: No   Sexual activity: Not on file  Other Topics Concern   Not on file  Social History Narrative   Lives with wife   Current disabled   Social Determinants of Health   Financial Resource Strain: Not on file  Food Insecurity: Not on file  Transportation Needs: Not on file  Physical Activity: Not on file  Stress: Not on file  Social Connections: Not on file     Family History: The patient's family history includes Cancer in his paternal uncle; Coronary artery disease in an other family member; Diabetes in his mother; Kidney disease in his mother; Stroke in his mother; Tremor in his father. There is no history of Colon cancer, Esophageal cancer, Rectal cancer, or Stomach cancer.  ROS:   Please see the history of present  illness.     All other systems reviewed and are negative.  EKGs/Labs/Other Studies Reviewed:    The following studies were reviewed today: ER visit, MRI reviewed  Cardiac Studies & Procedures       ECHOCARDIOGRAM  ECHOCARDIOGRAM COMPLETE 12/25/2022  Narrative ECHOCARDIOGRAM REPORT    Patient Name:   [name] Date of Exam: 12/25/2022 Medical Rec #:  [id number]       Height:       72.0 in Accession #:    [id number]      Weight:       164.4 lb Date of Birth:  1945/10/28       BSA:          1.960 m Patient Age:    76 years        BP:           125/69 mmHg Patient Gender: M               HR:           53 bpm. Exam Location:  [address]  Procedure: 2D Echo, Cardiac Doppler, Color Doppler and Intracardiac Opacification Agent  Indications:    I05.9 Mitral valve disorder  History:        Patient has prior history of Echocardiogram examinations, most recent 04/06/2017. CAD, Arrythmias:RBBB, Signs/Symptoms:Fatigue; Risk Factors:Hypertension and Dyslipidemia. Near syncope. Bradycardia.  Sonographer:    Cathie Beams RCS Referring Phys: [phone] Zachary George SWINYER  IMPRESSIONS   1. Left ventricular ejection fraction, by estimation, is 60 to 65%. The left ventricle has normal function. The left ventricle has no regional wall motion abnormalities. Left ventricular diastolic parameters are consistent with Grade I diastolic dysfunction (impaired relaxation). 2. Right ventricular systolic function is normal. The right ventricular size is normal. 3. The mitral valve is normal in structure. Trivial mitral valve regurgitation. No evidence of mitral stenosis. 4. The aortic valve is tricuspid. Aortic valve regurgitation is not visualized. No aortic stenosis is present. 5. The inferior vena cava is normal in size with greater than 50% respiratory variability, suggesting right atrial pressure of 3 mmHg.  FINDINGS Left Ventricle: Left ventricular ejection fraction, by estimation, is 60 to  65%. The left ventricle has normal function. The left ventricle has no regional wall motion abnormalities. The left ventricular internal cavity size was normal in size. There is no left ventricular hypertrophy. Left ventricular diastolic parameters are consistent with Grade I diastolic dysfunction (impaired relaxation). Normal left ventricular filling pressure.  Right Ventricle: The right ventricular size is normal. No increase in right ventricular wall thickness. Right ventricular systolic function is normal.  Left Atrium: Left atrial size  was normal in size.  Right Atrium: Right atrial size was normal in size.  Pericardium: There is no evidence of pericardial effusion.  Mitral Valve: The mitral valve is normal in structure. Trivial mitral valve regurgitation. No evidence of mitral valve stenosis.  Tricuspid Valve: The tricuspid valve is normal in structure. Tricuspid valve regurgitation is trivial. No evidence of tricuspid stenosis.  Aortic Valve: The aortic valve is tricuspid. Aortic valve regurgitation is not visualized. No aortic stenosis is present.  Pulmonic Valve: The pulmonic valve was normal in structure. Pulmonic valve regurgitation is trivial. No evidence of pulmonic stenosis.  Aorta: The aortic root is normal in size and structure.  Venous: The inferior vena cava is normal in size with greater than 50% respiratory variability, suggesting right atrial pressure of 3 mmHg.  IAS/Shunts: No atrial level shunt detected by color flow Doppler.   LEFT VENTRICLE PLAX 2D LVIDd:         4.00 cm   Diastology LVIDs:         2.40 cm   LV e' medial:    6.22 cm/s LV PW:         1.00 cm   LV E/e' medial:  9.6 LV IVS:        1.00 cm   LV e' lateral:   6.66 cm/s LVOT diam:     2.10 cm   LV E/e' lateral: 8.9 LV SV:         86 LV SV Index:   44 LVOT Area:     3.46 cm   RIGHT VENTRICLE RV Basal diam:  2.60 cm RV S prime:     12.20 cm/s TAPSE (M-mode): 1.1 cm  LEFT ATRIUM              Index        RIGHT ATRIUM           Index LA diam:        4.00 cm 2.04 cm/m   RA Area:     12.10 cm LA Vol (A2C):   33.9 ml 17.29 ml/m  RA Volume:   25.00 ml  12.75 ml/m LA Vol (A4C):   36.3 ml 18.52 ml/m LA Biplane Vol: 36.7 ml 18.72 ml/m AORTIC VALVE LVOT Vmax:   103.00 cm/s LVOT Vmean:  66.800 cm/s LVOT VTI:    0.248 m  AORTA Ao Root diam: 3.10 cm Ao Asc diam:  3.90 cm  MITRAL VALVE               TRICUSPID VALVE MV Area (PHT): 2.38 cm    TR Peak grad:   16.0 mmHg MV Decel Time: 319 msec    TR Vmax:        200.00 cm/s MV E velocity: 59.50 cm/s MV A velocity: 89.70 cm/s  SHUNTS MV E/A ratio:  0.66        Systemic VTI:  0.25 m Systemic Diam: 2.10 cm  Chilton Si MD Electronically signed by Chilton Si MD Signature Date/Time: 12/25/2022/3:47:03 PM    Final    MONITORS  LONG TERM MONITOR (3-14 DAYS) 04/18/2022  Narrative   Sinus rhythm with avg HR of 65 bpm   Rare short bursts of atrial tachycardia   Rare PAC's and PVC's   No atrial fibrillation or pauses   Dizzy symptoms were associated with sinus rhythm, pointing to a non cardiac cause   Patch Wear Time:  12 days and 21 hours (2023-08-02T19:31:00-0400 to 2023-08-15T16:31:26-0400)  Patient had a min HR  of 47 bpm, max HR of 162 bpm, and avg HR of 65 bpm. Predominant underlying rhythm was Sinus Rhythm. 6 Supraventricular Tachycardia runs occurred, the run with the fastest interval lasting 5 beats with a max rate of 162 bpm, the longest lasting 19 beats with an avg rate of 129 bpm. Isolated SVEs were rare (<1.0%), SVE Couplets were rare (<1.0%), and SVE Triplets were rare (<1.0%). Isolated VEs were rare (<1.0%), and no VE Couplets or VE Triplets were present.            EKG:  Prior sinus rhythm 65 incomplete right bundle branch block  Recent Labs: 03/08/2022: B Natriuretic Peptide 56.2; BUN 11; Creatinine, Ser 0.90; Hemoglobin 15.1; Platelets 154; Potassium 3.9; Sodium 140  Recent Lipid Panel     Component Value Date/Time   CHOL 114 11/29/2021 0814   TRIG 81 11/29/2021 0814   HDL 37 (L) 11/29/2021 0814   CHOLHDL 3.1 11/29/2021 0814   CHOLHDL 4 01/29/2015 0849   VLDL 18.8 01/29/2015 0849   LDLCALC 61 11/29/2021 0814     Risk Assessment/Calculations:              Physical Exam:    VS:  BP 128/76   Pulse 65   Ht 6' (1.829 m)   Wt 162 lb 9.6 oz (73.8 kg)   SpO2 95%   BMI 22.05 kg/m     Wt Readings from Last 3 Encounters:  12/31/22 162 lb 9.6 oz (73.8 kg)  10/14/22 164 lb 6.4 oz (74.6 kg)  04/08/22 184 lb 2 oz (83.5 kg)     GEN: Well nourished, well developed, in no acute distress HEENT: normal Neck: no JVD, carotid bruits, or masses Cardiac: RRR; no murmurs, rubs, or gallops,no edema  Respiratory:  clear to auscultation bilaterally, normal work of breathing GI: soft, nontender, nondistended, + BS MS: no deformity or atrophy Skin: warm and dry, no rash Neuro:  Alert and Oriented x 3, Strength and sensation are intact, minimal tremor Psych: euthymic mood, full affect   ASSESSMENT:    1. Dizziness   2. Parkinson's disease without dyskinesia or fluctuating manifestations     PLAN:    In order of problems listed above:  Coronary artery disease -RCA PCI 2003 stable with no symptoms of angina, aspirin other goal-directed medical therapy. -Continue with aspirin, statin high intensity.  At goal.   Bradycardia -Stopped propranolol in the past.  Improved.  Current heart rate 65.  Incomplete right bundle branch block.   Essential hypertension -Doing well, well controlled.  In fact had hypotension, and had to stop lisinopril at one point.  Stopped isosorbide.  Doing well.   Hyperlipidemia -Prior lipid panel showed LDL 61 creatinine 0.9 hemoglobin 15.1.  No evidence of anemia.  Continue Crestor 10 mg.   Parkinson's -On Sinemet.  Doing well.  Improved.       Medication Adjustments/Labs and Tests Ordered: Current medicines are reviewed at length with  the patient today.  Concerns regarding medicines are outlined above.  No orders of the defined types were placed in this encounter.  No orders of the defined types were placed in this encounter.   Patient Instructions  Medication Instructions:  Your physician recommends that you continue on your current medications as directed. Please refer to the Current Medication list given to you today.  *If you need a refill on your cardiac medications before your next appointment, please call your pharmacy*  Follow-Up: At [facility], you and your health needs  are our priority.  As part of our continuing mission to provide you with exceptional heart care, we have created designated Provider Care Teams.  These Care Teams include your primary Cardiologist (physician) and Advanced Practice Providers (APPs -  Physician Assistants and Nurse Practitioners) who all work together to provide you with the care you need, when you need it.  We recommend signing up for the patient portal called "MyChart".  Sign up information is provided on this After Visit Summary.  MyChart is used to connect with patients for Virtual Visits (Telemedicine).  Patients are able to view lab/test results, encounter notes, upcoming appointments, etc.  Non-urgent messages can be sent to your provider as well.   To learn more about what you can do with MyChart, go to [email or web address].    Your next appointment:   1 year(s)  Provider:   Donato Schultz, MD        Signed, Donato Schultz, MD  12/31/2022 1:37 PM    [facility]

## 2023-01-21 ENCOUNTER — Telehealth: Payer: Self-pay | Admitting: Diagnostic Neuroimaging

## 2023-01-21 NOTE — Telephone Encounter (Signed)
Sent mychart msg informing pt of appt change due to provider schedule change 

## 2023-01-26 ENCOUNTER — Encounter: Payer: Self-pay | Admitting: Gastroenterology

## 2023-02-24 ENCOUNTER — Other Ambulatory Visit: Payer: Self-pay | Admitting: Nurse Practitioner

## 2023-06-15 ENCOUNTER — Ambulatory Visit: Payer: Medicare PPO | Admitting: Diagnostic Neuroimaging

## 2023-06-18 ENCOUNTER — Ambulatory Visit: Payer: Medicare PPO | Admitting: Diagnostic Neuroimaging

## 2023-07-03 ENCOUNTER — Ambulatory Visit (INDEPENDENT_AMBULATORY_CARE_PROVIDER_SITE_OTHER): Payer: Medicare PPO

## 2023-07-09 ENCOUNTER — Encounter (INDEPENDENT_AMBULATORY_CARE_PROVIDER_SITE_OTHER): Payer: Self-pay

## 2023-07-09 ENCOUNTER — Ambulatory Visit (INDEPENDENT_AMBULATORY_CARE_PROVIDER_SITE_OTHER): Payer: Medicare PPO | Admitting: Audiology

## 2023-07-09 ENCOUNTER — Ambulatory Visit (INDEPENDENT_AMBULATORY_CARE_PROVIDER_SITE_OTHER): Payer: Medicare PPO | Admitting: Otolaryngology

## 2023-07-09 VITALS — Ht 72.0 in | Wt 165.0 lb

## 2023-07-09 DIAGNOSIS — H903 Sensorineural hearing loss, bilateral: Secondary | ICD-10-CM

## 2023-07-09 DIAGNOSIS — H9042 Sensorineural hearing loss, unilateral, left ear, with unrestricted hearing on the contralateral side: Secondary | ICD-10-CM | POA: Insufficient documentation

## 2023-07-09 DIAGNOSIS — H6122 Impacted cerumen, left ear: Secondary | ICD-10-CM | POA: Diagnosis not present

## 2023-07-09 DIAGNOSIS — R42 Dizziness and giddiness: Secondary | ICD-10-CM | POA: Diagnosis not present

## 2023-07-09 NOTE — Progress Notes (Signed)
Patient ID: [name], male   DOB: [birth year], 77 y.o.   MRN: [id number]  Follow-up: Chronic dizziness, asymmetric left ear hearing loss  HPI: The patient is a 77 year old male who returns today for his follow-up evaluation.  The patient was previously seen for chronic dizziness and asymmetric left ear sensorineural hearing loss.  His MRI scan and carotid ultrasound were all negative.  His vestibular neurodiagnostic testing showed findings consistent with central vestibular dysfunction.  The patient returns today reporting significant improvement in his dizziness.  Currently his balance is good.  However, his wife complains that the patient's hearing has progressively worsened.  Currently the patient denies any otalgia, otorrhea, or vertigo.  Exam: General: Communicates without difficulty, well nourished, no acute distress. Head: Normocephalic, no evidence injury, no tenderness, facial buttresses intact without stepoff. Face/sinus: No tenderness to palpation and percussion. Facial movement is normal and symmetric. Eyes: PERRL, EOMI. No scleral icterus, conjunctivae clear. Neuro: CN II exam reveals vision grossly intact.  No nystagmus at any point of gaze. Ears: Auricles well formed without lesions.  Left ear cerumen impaction.  Nose: External evaluation reveals normal support and skin without lesions.  Dorsum is intact.  Anterior rhinoscopy reveals congested mucosa over anterior aspect of inferior turbinates and intact septum.  No purulence noted. Oral:  Oral cavity and oropharynx are intact, symmetric, without erythema or edema.  Mucosa is moist without lesions. Neck: Full range of motion without pain.  There is no significant lymphadenopathy.  No masses palpable.  Thyroid bed within normal limits to palpation.  Parotid glands and submandibular glands equal bilaterally without mass.  Trachea is midline. Neuro:  CN 2-12 grossly intact.   Procedure: Left ear cerumen disimpaction Anesthesia:  None Description: Under the operating microscope, the cerumen is carefully removed with a combination of cerumen currette, alligator forceps, and suction catheters.  After the cerumen is removed, the TMs are noted to be normal.  No mass, erythema, or lesions. The patient tolerated the procedure well.    The hearing test shows stable asymmetric left ear sensorineural hearing loss.  Assessment: 1.  Left ear cerumen impaction.  After the disimpaction procedure, both tympanic membranes and middle ear spaces are noted to be normal. 2.  The patient's chronic dizziness/central vestibular dysfunction has mostly resolved.  Currently he has no balance difficulty. 3.  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  Plan: 1.  Otomicroscopy with left ear cerumen disimpaction. 2.  The physical exam findings and the hearing test results are reviewed with the patient. 3.  The patient is reassured that his hearing levels are stable.  The hearing amplification options are discussed. 4.  The patient will return for reevaluation in 1 year, sooner if needed.

## 2023-07-10 NOTE — Progress Notes (Signed)
  [address], [address] [place], [place] [address] [phone]  Audiological Evaluation    Name: [name]     DOB:   Dec 12, 1945      MRN:   [id number]                                                                                     Service Date: 07/10/2023         Patient comes today after Dr. Suszanne Conners, ENT sent a referral for a hearing evaluation due to concerns with hearing loss.   Symptoms Yes Details  Hearing loss  [x]  Previous audiogram complete at Dr. [facility].  Tinnitus  [x]  Both ears  Ear pain/ Ear infections  []    Balance problems  [x]  Reports he is doing better, Had balance testing at Dr. [facility] S[facility] clinic.  Noise exposure  []    Previous ear surgeries  []    Family history  []    Amplification  []    Other  []      Otoscopy: Right ear: Clear external ear canals and notable landmarks visualized on the tympanic membrane. Left ear:  Clear external ear canals and notable landmarks visualized on the tympanic membrane.  Tympanometry: Right ear: Type Ad- Normal external ear canal volume with normal middle ear pressure and high tympanic membrane compliance Left ear: Type Ad- Normal external ear canal volume with normal middle ear pressure and high tympanic membrane compliance   Pure tone Audiometry: Right ear- Normal to moderate sensorineural hearing loss from 250 Hz - 8000 Hz. Left ear-  Mild to severe sensorineural hearing loss from 250 Hz - 8000 Hz.  The hearing test results were completed under headphones and re-checked with inserts and results are deemed to be of good reliability. Test technique:  conventional     Speech Audiometry: Right ear- Speech Reception Threshold (SRT) was obtained at 30 dBHL Left ear-Speech Reception Threshold (SRT) was obtained at 30 dBHL   Word Recognition Score Tested using NU-6 (MLV) Right ear: 84% was obtained at a presentation level of 70 dBHL with contralateral masking which is deemed as  good  Left ear: 52% was obtained  at a presentation level of 90 dBHL with contralateral masking which is deemed as  poor    Impression: The significant difference in pure tone thresholds and word recognition scores , worse in the left ear continues to be observed.    Recommendations: Follow up with ENT as scheduled for today. Return for a hearing evaluation if concerns with hearing changes arise or per MD recommendation. Consider a communication needs assessment after medical clearance for hearing aids is obtained.   Prapti Grussing MARIE LEROUX-MARTINEZ, AUD

## 2023-07-17 ENCOUNTER — Encounter: Payer: Self-pay | Admitting: Audiology

## 2023-07-27 ENCOUNTER — Other Ambulatory Visit: Payer: Self-pay | Admitting: Diagnostic Neuroimaging

## 2023-11-04 ENCOUNTER — Other Ambulatory Visit: Payer: Self-pay | Admitting: Neurology

## 2023-11-04 NOTE — Telephone Encounter (Signed)
 Last seen on 10/14/22 per note " Return in about 8 months (around 06/14/2023). " No follow up scheduled

## 2024-01-07 ENCOUNTER — Ambulatory Visit (HOSPITAL_BASED_OUTPATIENT_CLINIC_OR_DEPARTMENT_OTHER): Admitting: Cardiology

## 2024-02-12 ENCOUNTER — Ambulatory Visit (HOSPITAL_BASED_OUTPATIENT_CLINIC_OR_DEPARTMENT_OTHER): Admitting: Cardiology

## 2024-02-12 ENCOUNTER — Encounter (HOSPITAL_BASED_OUTPATIENT_CLINIC_OR_DEPARTMENT_OTHER): Payer: Self-pay | Admitting: Cardiology

## 2024-02-12 VITALS — BP 134/68 | HR 59 | Ht 72.0 in | Wt 163.0 lb

## 2024-02-12 DIAGNOSIS — G20A1 Parkinson's disease without dyskinesia, without mention of fluctuations: Secondary | ICD-10-CM | POA: Diagnosis not present

## 2024-02-12 DIAGNOSIS — I340 Nonrheumatic mitral (valve) insufficiency: Secondary | ICD-10-CM | POA: Diagnosis not present

## 2024-02-12 DIAGNOSIS — E782 Mixed hyperlipidemia: Secondary | ICD-10-CM | POA: Diagnosis not present

## 2024-02-12 NOTE — Patient Instructions (Signed)
 Medication Instructions:  The current medical regimen is effective;  continue present plan and medications.  *If you need a refill on your cardiac medications before your next appointment, please call your pharmacy*  Follow-Up: At [facility], you and your health needs are our priority.  As part of our continuing mission to provide you with exceptional heart care, our providers are all part of one team.  This team includes your primary Cardiologist (physician) and Advanced Practice Providers or APPs (Physician Assistants and Nurse Practitioners) who all work together to provide you with the care you need, when you need it.  Your next appointment:   2 year(s)  Provider:   Donato Schultz, MD     We recommend signing up for the patient portal called "MyChart".  Sign up information is provided on this After Visit Summary.  MyChart is used to connect with patients for Virtual Visits (Telemedicine).  Patients are able to view lab/test results, encounter notes, upcoming appointments, etc.  Non-urgent messages can be sent to your provider as well.   To learn more about what you can do with MyChart, go to [email or web address].

## 2024-02-16 NOTE — Progress Notes (Signed)
 Cardiology Office Note:  .   Date:  02/16/2024  ID:  Tyquavious, Gamel 04/05/1946, MRN [id number] PCP: Arloa Jarvis, NP  [facility] Providers Cardiologist:  Oneil Parchment, MD     History of Present Illness: .   [name] is a 78 y.o. male Discussed the use of AI scribe software for clinical note transcription with the patient, who gave verbal consent to proceed.  History of Present Illness [name] is a 78 year old male with coronary artery disease and Parkinson's disease who presents for a routine cardiovascular follow-up.  He has a history of coronary artery disease, having undergone a right coronary artery percutaneous coronary intervention (RCA PCI) in 2003. A recent echocardiogram in 2024 showed normal heart function with a normal mitral valve and only trivial leakage. A carotid ultrasound in 2023 showed no significant stenosis, and an event monitor in 2023 revealed no adverse arrhythmias. He is on aspirin  81 mg and Crestor  10 mg (rosuvastatin ) for cardiovascular prevention. His LDL cholesterol is 61 mg/dL, hemoglobin is 84.8 g/dL, and creatinine is 0.9 mg/dL. No recent chest pain, shortness of breath, or fainting episodes.  His Parkinson's disease has been fairly stable, although he experiences some shakiness that varies in severity. He is currently taking Sinemet  for Parkinson's disease.      Studies Reviewed: SABRA   EKG Interpretation Date/Time:  Friday February 12 2024 13:57:18 EDT Ventricular Rate:  59 PR Interval:  164 QRS Duration:  96 QT Interval:  414 QTC Calculation: 409 R Axis:   30  Text Interpretation: Sinus bradycardia Minimal voltage criteria for LVH, may be normal variant ( R in aVL ) Possible Lateral infarct , age undetermined Possible Inferior infarct , age undetermined When compared with ECG of 08-Mar-2022 12:29, No significant change since last tracing Confirmed by Parchment Oneil ([id number]) on 02/12/2024 2:28:35 PM    Results LABS LDL  cholesterol: 61 Hb: 15.1 Cr: 0.9  RADIOLOGY Carotid ultrasound: Mild plaque, no significant stenosis (2023)  DIAGNOSTIC Echocardiogram: Normal heart function, trivial mitral valve leakage (2023) Event monitor: No adverse arrhythmias (2023) EKG: Normal Risk Assessment/Calculations:           Physical Exam:   VS:  BP 134/68   Pulse (!) 59   Ht 6' (1.829 m)   Wt 163 lb (73.9 kg)   SpO2 98%   BMI 22.11 kg/m    Wt Readings from Last 3 Encounters:  02/12/24 163 lb (73.9 kg)  07/09/23 165 lb (74.8 kg)  12/31/22 162 lb 9.6 oz (73.8 kg)    GEN: Well nourished, well developed in no acute distress NECK: No JVD; No carotid bruits CARDIAC: RRR, no murmurs, no rubs, no gallops RESPIRATORY:  Clear to auscultation without rales, wheezing or rhonchi  ABDOMEN: Soft, non-tender, non-distended EXTREMITIES:  No edema; No deformity   ASSESSMENT AND PLAN: .    Assessment and Plan Assessment & Plan Coronary artery disease Coronary artery disease is well-managed. No angina, dyspnea, or syncope. RCA PCI in 2003 with normal cardiac function on echocardiogram in 2024. Event monitor in 2023 showed no arrhythmias. Carotid ultrasound in 2023 showed no significant stenosis. - Continue aspirin  81 mg oral daily - Continue rosuvastatin  10 mg oral at bedtime - Schedule follow-up in two years unless symptoms change  Hyperlipidemia Hyperlipidemia is well-controlled with LDL cholesterol at 61 mg/dL. - Continue rosuvastatin  10 mg oral at bedtime  Parkinson's disease Parkinson's disease is well-managed with intermittent tremors. No significant changes in symptoms.  Signed, Oneil Parchment, MD

## 2024-09-05 ENCOUNTER — Encounter (HOSPITAL_BASED_OUTPATIENT_CLINIC_OR_DEPARTMENT_OTHER): Payer: Self-pay | Admitting: Cardiology
